# Patient Record
Sex: Female | Born: 1995 | Race: Black or African American | Hispanic: No | Marital: Single | Smoking: Current every day smoker
Health system: Southern US, Community
[De-identification: ages and names within clinical notes are randomized; demographics above are authoritative.]

## PROBLEM LIST (undated history)

## (undated) ENCOUNTER — Inpatient Hospital Stay (HOSPITAL_COMMUNITY): Payer: Self-pay

## (undated) DIAGNOSIS — J45909 Unspecified asthma, uncomplicated: Secondary | ICD-10-CM

## (undated) DIAGNOSIS — G43909 Migraine, unspecified, not intractable, without status migrainosus: Secondary | ICD-10-CM

## (undated) HISTORY — DX: Migraine, unspecified, not intractable, without status migrainosus: G43.909

## (undated) HISTORY — PX: NO PAST SURGERIES: SHX2092

---
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
## 2014-08-16 ENCOUNTER — Encounter (HOSPITAL_COMMUNITY): Payer: Self-pay | Admitting: Emergency Medicine

## 2014-08-16 ENCOUNTER — Emergency Department (HOSPITAL_COMMUNITY)
Admission: EM | Admit: 2014-08-16 | Discharge: 2014-08-16 | Disposition: A | Discharge status: Home / Self Care | Payer: Self-pay | Attending: Emergency Medicine | Admitting: Emergency Medicine

## 2014-08-16 DIAGNOSIS — Z720 Tobacco use: Secondary | ICD-10-CM | POA: Insufficient documentation

## 2014-08-16 DIAGNOSIS — J45909 Unspecified asthma, uncomplicated: Secondary | ICD-10-CM | POA: Insufficient documentation

## 2014-08-16 DIAGNOSIS — R519 Headache, unspecified: Secondary | ICD-10-CM

## 2014-08-16 DIAGNOSIS — R51 Headache: Secondary | ICD-10-CM | POA: Insufficient documentation

## 2014-08-16 HISTORY — DX: Unspecified asthma, uncomplicated: J45.909

## 2014-08-16 LAB — I-STAT CHEM 8, ED
BUN: 4 mg/dL — AB (ref 6–20)
CALCIUM ION: 1.22 mmol/L (ref 1.12–1.23)
Chloride: 104 mmol/L (ref 101–111)
Creatinine, Ser: 0.8 mg/dL (ref 0.44–1.00)
Glucose, Bld: 88 mg/dL (ref 65–99)
HCT: 44 % (ref 36.0–46.0)
Hemoglobin: 15 g/dL (ref 12.0–15.0)
POTASSIUM: 3.5 mmol/L (ref 3.5–5.1)
Sodium: 140 mmol/L (ref 135–145)
TCO2: 22 mmol/L (ref 0–100)

## 2014-08-16 LAB — I-STAT BETA HCG BLOOD, ED (MC, WL, AP ONLY): I-stat hCG, quantitative: <5 m[IU]/mL (ref <5)

## 2014-08-16 MED ORDER — BUTALBITAL-APAP-CAFFEINE 50-325-40 MG PO TABS: 1.0000 | ORAL_TABLET | Freq: Four times a day (QID) | ORAL | Status: DC | PRN

## 2014-08-16 MED ORDER — METHYLPREDNISOLONE SODIUM SUCC 125 MG IJ SOLR
125.0000 mg | Freq: Once | INTRAMUSCULAR | Status: AC
  Administered 2014-08-16: 125 mg via INTRAVENOUS
  Filled 2014-08-16: qty 2

## 2014-08-16 MED ORDER — SODIUM CHLORIDE 0.9 % IV BOLUS (SEPSIS)
1000.0000 mL | Freq: Once | INTRAVENOUS | Status: AC
  Administered 2014-08-16: 1000 mL via INTRAVENOUS

## 2014-08-16 MED ORDER — KETOROLAC TROMETHAMINE 30 MG/ML IJ SOLN
30.0000 mg | Freq: Once | INTRAMUSCULAR | Status: DC
  Filled 2014-08-16: qty 1

## 2014-08-16 MED ORDER — MAGNESIUM SULFATE 2 GM/50ML IV SOLN
2.0000 g | Freq: Once | INTRAVENOUS | Status: AC
  Administered 2014-08-16: 2 g via INTRAVENOUS
  Filled 2014-08-16: qty 50

## 2014-08-16 MED ORDER — PROCHLORPERAZINE EDISYLATE 5 MG/ML IJ SOLN
10.0000 mg | Freq: Once | INTRAMUSCULAR | Status: AC
  Administered 2014-08-16: 10 mg via INTRAVENOUS
  Filled 2014-08-16: qty 2

## 2014-08-16 MED ORDER — DIPHENHYDRAMINE HCL 50 MG/ML IJ SOLN
25.0000 mg | Freq: Once | INTRAMUSCULAR | Status: AC
  Administered 2014-08-16: 25 mg via INTRAVENOUS
  Filled 2014-08-16: qty 1

## 2014-08-16 NOTE — Discharge Instructions (Signed)
Do not hesitate to return to the emergency room for any new, worsening or concerning symptoms. ° °Please obtain primary care using resource guide below. Let them know that you were seen in the emergency room and that they will need to obtain records for further outpatient management. ° ° °General Headache Without Cause °A general headache is pain or discomfort felt around the head or neck area. The cause may not be found.  °HOME CARE  °· Keep all doctor visits. °· Only take medicines as told by your doctor. °· Lie down in a dark, quiet room when you have a headache. °· Keep a journal to find out if certain things bring on headaches. For example, write down: °¨ What you eat and drink. °¨ How much sleep you get. °¨ Any change to your diet or medicines. °· Relax by getting a massage or doing other relaxing activities. °· Put ice or heat packs on the head and neck area as told by your doctor. °· Lessen stress. °· Sit up straight. Do not tighten (tense) your muscles. °· Quit smoking if you smoke. °· Lessen how much alcohol you drink. °· Lessen how much caffeine you drink, or stop drinking caffeine. °· Eat and sleep on a regular schedule. °· Get 7 to 9 hours of sleep, or as told by your doctor. °· Keep lights dim if bright lights bother you or make your headaches worse. °GET HELP RIGHT AWAY IF:  °· Your headache becomes really bad. °· You have a fever. °· You have a stiff neck. °· You have trouble seeing. °· Your muscles are weak, or you lose muscle control. °· You lose your balance or have trouble walking. °· You feel like you will pass out (faint), or you pass out. °· You have really bad symptoms that are different than your first symptoms. °· You have problems with the medicines given to you by your doctor. °· Your medicines do not work. °· Your headache feels different than the other headaches. °· You feel sick to your stomach (nauseous) or throw up (vomit). °MAKE SURE YOU:  °· Understand these instructions. °· Will  watch your condition. °· Will get help right away if you are not doing well or get worse. °Document Released: 10/10/2007 Document Revised: 03/25/2011 Document Reviewed: 12/21/2010 °ExitCare® Patient Information ©2015 [employer]. This information is not intended to replace advice given to you by your health care provider. Make sure you discuss any questions you have with your health care provider. ° ° °Emergency Department Resource Guide °1) Find a Doctor and Pay Out of Pocket °Although you won't have to find out who is covered by your insurance plan, it is a good idea to ask around and get recommendations. You will then need to call the office and see if the doctor you have chosen will accept you as a new patient and what types of options they offer for patients who are self-pay. Some doctors offer discounts or will set up payment plans for their patients who do not have insurance, but you will need to ask so you aren't surprised when you get to your appointment. ° °2) Contact Your Local Health Department °Not all health departments have doctors that can see patients for sick visits, but many do, so it is worth a call to see if yours does. If you don't know where your local health department is, you can check in your phone book. The CDC also has a tool to help you locate your   state's health department, and many state websites also have listings of all of their local health departments. ° °3) Find a Walk-in Clinic °If your illness is not likely to be very severe or complicated, you may want to try a walk in clinic. These are popping up all over the country in pharmacies, drugstores, and shopping centers. They're usually staffed by nurse practitioners or physician assistants that have been trained to treat common illnesses and complaints. They're usually fairly quick and inexpensive. However, if you have serious medical issues or chronic medical problems, these are probably not your best option. ° °No Primary Care  Doctor: °- Call [facility] at  832-8000 - they can help you locate a primary care doctor that  accepts your insurance, provides certain services, etc. °- Physician Referral Service- 1-800-533-3463 ° °Chronic Pain Problems: °Organization         Address  Phone   Notes  °[facility]  (336) 297-2271 Patients need to be referred by their primary care doctor.  ° °Medication Assistance: °Organization         Address  Phone   Notes  °[place] [facility] [address] °[place], [place] [address] (336) 641-8030 --Must be a resident of [place] °-- Must have NO insurance coverage whatsoever (no Medicaid/ Medicare, etc.) °-- The pt. MUST have a primary care doctor that directs their care regularly and follows them in the community °  °[facility]  (866) 331-1348   °[employer]  (888) 892-1162   ° °Agencies that provide inexpensive medical care: °Organization         Address  Phone   Notes  °[facility]  (336) 832-8035   ° Internal Medicine    (336) 832-7272   °[facility] [address] °[place], [place] [address] (336) 832-4777   °[facility] [address], °[place] (336) 271-4999   °[facility]    (336) 373-0678   °[facility]    (336) 272-1050   °[facility] ° [address], [place] Phone:  (336) 832-4444, Fax:  (336) 832-4440 Hours of Operation:  9 am - 6 pm, M-F.  Also accepts Medicaid/Medicare and self-pay.  °[facility] ° [address], [place] Phone: (336) 832-3150, Fax: (336) 832-3151. Hours of Operation:  8:30 am - 5:30 pm, M-F.  Also accepts Medicaid and self-pay.  °[facility] [place] [address], [place] Phone: (336) 878-6027   °[facility] [address], [place], [place] (336)723-1848, Ext. 123 Mondays & Thursdays: 7-9 AM.  First 15 patients are seen on a first  come, first serve basis. °  ° °Medicaid-accepting [place] Providers: ° °Organization         Address  Phone   Notes  °[facility] [address], [place] (336) 641-2100 Also accepts self-pay patients.  °[facility] [address], [place] ° (336) 856-9996   °[facility] [address], [place] (336) 288-8857   °[facility] [address] [place] [address], [place] (336) 299-7000   °Veita Bland [address], [place]  ° (336) 373-1557 Only accepts  Access Medicaid patients after they have their name applied to their card.  ° °Self-Pay (no insurance) in [place]: ° °Organization           Address  Phone   Notes  °Sickle Cell Patients, [facility] [address], [place] (336) 832-1970   °[facility] [address], [place] (336) 832-4400   °[facility] ° [address], [place] (336) 992-4800   °[facility]/Dr. Osei-Bonsu ° [address] [place] [address], [place] or [address], [place] (336) 841-8500 Phone number for both [place] and [place] locations is the same.  °[facility] [address], [place] (336) 299-0000   °[facility] [address] [place] [address], [place] or [address] (336) 852-7530 °(336) 878-2260   °[facility] [address], [place] (336) 350-1642, phone; (336) 294-5005, fax Sees patients 1st and 3rd Saturday of every month.  Must not qualify for public or private insurance (i.e. Medicaid, Medicare, [place] Health Choice, Veterans' Benefits) • Household income should be no more than 200% of the poverty level •The clinic cannot treat you if you are pregnant or think you are pregnant • Sexually transmitted diseases are not treated at the clinic.  ° ° °Dental Care: °Organization          Address  Phone  Notes  °[facility] [address], [place] (336) 641-6152 Accepts children up to age 21 who are enrolled in Medicaid or [place] Health Choice; pregnant women with a Medicaid card; and children who have applied for Medicaid or [place] Health Choice, but were declined, whose parents can pay a reduced fee at time of service.  °[facility] [place]  [address], [place] (336) 641-7733 Accepts children up to age 21 who are enrolled in Medicaid or [place] Health Choice; pregnant women with a Medicaid card; and children who have applied for Medicaid or [place] Health Choice, but were declined, whose parents can pay a reduced fee at time of service.  °[facility] ° [address], [place] (336) 641-4533 Patients are seen by appointment only. Walk-ins are not accepted. [facility] will see patients 18 years of age and older. °Monday - Tuesday (8am-5pm) °Most Wednesdays (8:30-5pm) °$30 per visit, cash only  °[facility] ° [address], [place] (336) 641-4533 Patients are seen by appointment only. Walk-ins are not accepted. [facility] will see patients 18 years of age and older. °One Wednesday Evening (Monthly: Volunteer Based).  $30 per visit, cash only  °[facility]  (919) 537-3737 for adults; Children under age 4, call Graduate Pediatric Dentistry at (919) [phone]. Children aged 4-14, please call (919) [phone] to request a pediatric application. ° Dental services are provided in all areas of dental care including fillings, crowns and bridges, complete and partial dentures, implants, gum treatment, root canals, and extractions. Preventive care is also provided. Treatment is provided to both adults and children. °Patients are selected via a lottery and there is often a waiting list. °  °[facility] [address], ° ° (336) 763-8833 [email or web address] °  °[facility] [address], [place], [place] (336)723-1848, Ext. 123 Second and Fourth Thursday of each month, opens at 6:30 AM; Clinic ends at 9 AM.  Patients are seen on a first-come first-served basis, and a limited number are seen during each clinic.  ° °[facility] ° [address], [place], [place] (336) 723-7904   Eligibility Requirements °You must have lived in [place], [place], or [place] counties for at least the last three months. °  You cannot be eligible for state or federal sponsored healthcare insurance, including Veterans Administration, Medicaid, or Medicare. °  You generally cannot be eligible for healthcare insurance through your employer.  °  How to apply: °Eligibility screenings are held every Tuesday and Wednesday afternoon from 1:00 pm until 4:00 pm. You do not need an appointment for the interview!  °[facility] [address], [place], [place] 336-631-2330   °[facility]  336-342-8273   °[facility]  336-703-3100   °[facility]  336-570-6415   ° °Behavioral Health Resources in the Community: °Intensive Outpatient Programs °Organization         Address  Phone  Notes  °[facility] [address], [place], [place] 336-878-6098   °[facility] [address], [place], [place] 336-832-9800   °[facility] [address], [place], [place] ° 336-882-2125   °[facility] [address],  °[place], [place] 1-800-853-5163 or 336-641-4981   °Substance Abuse Resources °Organization         Address  Phone  Notes  °[facility]  336-882-2125   °[facility]  336-784-9470   °[facility]  336-285-9073   °[facility]  336-845-3988   °Residential & Outpatient Substance Abuse Program  1-800-659-3381   °Psychological  Services °Organization         Address  Phone  Notes  °[facility]  336- 832-9600   °[facility]  336- 378-7881   °[facility] [address], [place] 1-800-853-5163 or 336-641-4981   ° °Mobile Crisis Teams °Organization         Address  Phone  Notes  °[facility], Mobile Crisis Care Unit  1-877-626-1772   °Assertive °[facility] ° [address] [place], [place] 336-834-9664   °Sharon DeEsch [address] °[place] 336-554-5454   ° °Self-Help/Support Groups °Organization         Address  Phone             Notes  °[facility] - variety of support groups  336- 373-1402 Call for more information  °Narcotics Anonymous (NA), [facility] [address], °[place]  2 meetings at this location  ° °Residential Treatment Programs °Organization         Address  Phone  Notes  °[facility] [address],    °[place]  1-866-801-8205   °[facility] ° [address], [place], [place] 704-293-8524   °[facility] [address], [place] 336-845-3988 Admissions: 8am-3pm M-F  °[facility] [address]    °[place], [place] 336-841-1104   °[facility] [address], [place], [place] 336-379-7146   °[facility] [address]  °[place], [place] 336-285-9073   °[facility] Intensive Outpatient [address], [place], [place] 336-852-3033   °[facility] [address]  °[place], [place] 1-877-615-2722 or 336-784-9470   °[facility] [address] [place], [place] 336-227-7417 Accepts Medicaid  °[facility] [address]  °[place]  1-800-659-3381 Substance Abuse/Addiction Treatment  ° °[place] Behavioral Health Resources °Organization           Address  Phone  Notes  °[facility]  (888)  581-9988   °Julie Brannon, PhD [address] [place], [place]   (336) 349-5553 or (336) 951-0000   °[facility]   [address] °[place], [place] (336) 349-4454   °[facility] [address], [place], [place] (336) 342-8316 Insurance/Medicaid/sponsorship through [facility]  °[facility] [address]                                    [place], [place] (336) 342-8316 Therapy/tele-psych/case  °[facility] [address]  ° [place], [place] (336) 349-2233    °Dr. Arfeen  (336) 349-4544   °[facility] [place]  [employer] [place] [facility] 1) [address], [place] °2) [address], [place] °3)  [address], [place] (336) 349-3220 °(336) 342-7768 ° °(336) 342-8140   °[place] [facility] (336) 342-1394 or (336) 342-3537 (After Hours)    ° ° ° °

## 2014-08-16 NOTE — ED Notes (Signed)
The patient said she has had a headache that is intermittent since Sunday.  She says it is like a sharp pain and it is only on the left side.  She says it is near her ear.  The pain come and goes and when it is present she rates it 10/10.  She denies any nausea or vomiting, she does say she has doziness and blurred vision when the pain is present.

## 2014-08-16 NOTE — ED Provider Notes (Signed)
CSN: [id number]     Arrival date & time 08/16/14  1847 History   First MD Initiated Contact with Patient 08/16/14 2051     Chief Complaint  Patient presents with  . Headache    The patient said she has had a headache that is intermittent since Sunday.  She says it is like a sharp pain and it is only on the left side.  She says it is near her ear.       (Consider location/radiation/quality/duration/timing/severity/associated sxs/prior Treatment) HPI  Blood pressure 125/77, pulse 91, temperature 98.3 F (36.8 C), temperature source Oral, resp. rate 16, height 5\' 3"  (1.6 m), weight 140 lb (63.504 kg), last menstrual period 08/09/2014, SpO2 100 %.  Samantha Knight is a 19 y.o. female complaining of sharp intermittent pain to the left side of her head focused around the ear intermittently starting 3 days ago, states it's becoming more severe, states it's 10 out of 10 right now. Is been no pain medication taken prior to arrival. She states she's hardly been eating and drinking over the last week, thinks it may be secondary to heat. Pt denies fever, rash, confusion, cervicalgia, LOC/syncope, change in vision, N/V, numbness, weakness, dysarthria, ataxia, thunderclap onset, exacerbation with exertion or valsalva, exacerbation in morning, CP, SOB, abdominal pain.  Past Medical History  Diagnosis Date  . Asthma    History reviewed. No pertinent past surgical history. History reviewed. No pertinent family history. History  Substance Use Topics  . Smoking status: Current Every Day Smoker    Types: Cigars  . Smokeless tobacco: Never Used  . Alcohol Use: No   OB History    No data available     Review of Systems  10 systems reviewed and found to be negative, except as noted in the HPI.   Allergies  Review of patient's allergies indicates not on file.  Home Medications   Prior to Admission medications   Not on File   BP 125/77 mmHg  Pulse 91  Temp(Src) 98.3 F (36.8 C) (Oral)   Resp 16  Ht 5\' 3"  (1.6 m)  Wt 140 lb (63.504 kg)  BMI 24.81 kg/m2  SpO2 100%  LMP 08/09/2014 Physical Exam  Constitutional: She is oriented to person, place, and time. She appears well-developed and well-nourished.  HENT:  Head: Normocephalic and atraumatic.  Mouth/Throat: Oropharynx is clear and moist.  No drooling or stridor. Posterior pharynx mildly erythematous no significant tonsillar hypertrophy. No exudate. Soft palate rises symmetrically. No TTP or induration under tongue.   No tenderness to palpation of frontal or bilateral maxillary sinuses.  No mucosal edema in the nares.  Bilateral tympanic membranes with normal architecture and good light reflex.    Eyes: Conjunctivae and EOM are normal. Pupils are equal, round, and reactive to light.  No TTP of maxillary or frontal sinuses  No TTP or induration of temporal arteries bilaterally  Neck: Normal range of motion. Neck supple.  FROM to C-spine. Pt can touch chin to chest without discomfort. No TTP of midline cervical spine.   Cardiovascular: Normal rate, regular rhythm and intact distal pulses.   Pulmonary/Chest: Effort normal and breath sounds normal. No respiratory distress. She has no wheezes. She has no rales. She exhibits no tenderness.  Abdominal: Soft. Bowel sounds are normal. There is no tenderness.  Musculoskeletal: Normal range of motion. She exhibits no edema or tenderness.  Neurological: She is alert and oriented to person, place, and time. No cranial nerve deficit.  II-Visual fields  grossly intact. III/IV/VI-Extraocular movements intact.  Pupils reactive bilaterally. V/VII-Smile symmetric, equal eyebrow raise,  facial sensation intact VIII- Hearing grossly intact IX/X-Normal gag XI-bilateral shoulder shrug XII-midline tongue extension Motor: 5/5 bilaterally with normal tone and bulk Cerebellar: Normal finger-to-nose  and normal heel-to-shin test.   Romberg negative Ambulates with a coordinated gait    Nursing note and vitals reviewed.   ED Course  Procedures (including critical care time) Labs Review Labs Reviewed - No data to display  Imaging Review No results found.   EKG Interpretation None      MDM   Final diagnoses:  Nonintractable headache, unspecified chronicity pattern, unspecified headache type    Filed Vitals:   08/16/14 1919 08/16/14 2054 08/16/14 2058  BP: 118/81 125/77   Pulse: 89  91  Temp: 98.3 F (36.8 C)    TempSrc: Oral    Resp: 16    Height:  (1.6 m)    Weight: 140 lb (63.504 kg)    SpO2: 100%  100%    Medications  sodium chloride 0.9 % bolus 1,000 mL (not administered)  magnesium sulfate IVPB 2 g 50 mL (not administered)  prochlorperazine (COMPAZINE) injection 10 mg (not administered)  methylPREDNISolone sodium succinate (SOLU-MEDROL) 125 mg/2 mL injection 125 mg (not administered)  diphenhydrAMINE (BENADRYL) injection 25 mg (not administered)    Samantha Knight is a pleasant 19 y.o. female presenting with HA. Presentation non concerning for [facility], ICH, Meningitis, or temporal arteritis. Pt is afebrile with no focal neuro deficits, nuchal rigidity, or change in vision. Pt is to follow up with PCP to discuss prophylactic medication. Pt verbalizes understanding and is agreeable with plan to dc.  Evaluation does not show pathology that would require ongoing emergent intervention or inpatient treatment. Pt is hemodynamically stable and mentating appropriately. Discussed findings and plan with patient/guardian, who agrees with care plan. All questions answered. Return precautions discussed and outpatient follow up given.   Discharge Medication List as of 08/16/2014 10:27 PM    START taking these medications   Details  butalbital-acetaminophen-caffeine (FIORICET) 50-325-40 MG per tablet Take 1 tablet by mouth every 6 (six) hours as needed for headache., Starting 08/16/2014, Until Discontinued, Print             Wynetta Emery,  PA-C 08/16/14 2324  Raeford Razor, MD 08/17/14 1455

## 2016-12-12 ENCOUNTER — Ambulatory Visit: Payer: Self-pay | Admitting: Maternal Newborn

## 2017-01-10 ENCOUNTER — Ambulatory Visit (INDEPENDENT_AMBULATORY_CARE_PROVIDER_SITE_OTHER): Payer: Managed Care, Other (non HMO) | Admitting: Maternal Newborn

## 2017-01-10 ENCOUNTER — Ambulatory Visit (INDEPENDENT_AMBULATORY_CARE_PROVIDER_SITE_OTHER): Payer: Managed Care, Other (non HMO)

## 2017-01-10 ENCOUNTER — Encounter: Payer: Self-pay | Admitting: Maternal Newborn

## 2017-01-10 VITALS — BP 112/74 | HR 83 | Wt 172.0 lb

## 2017-01-10 DIAGNOSIS — Z3480 Encounter for supervision of other normal pregnancy, unspecified trimester: Secondary | ICD-10-CM | POA: Insufficient documentation

## 2017-01-10 DIAGNOSIS — N912 Amenorrhea, unspecified: Secondary | ICD-10-CM | POA: Diagnosis not present

## 2017-01-10 DIAGNOSIS — Z3401 Encounter for supervision of normal first pregnancy, first trimester: Secondary | ICD-10-CM

## 2017-01-10 DIAGNOSIS — Z362 Encounter for other antenatal screening follow-up: Secondary | ICD-10-CM

## 2017-01-10 DIAGNOSIS — Z0189 Encounter for other specified special examinations: Secondary | ICD-10-CM

## 2017-01-10 DIAGNOSIS — O219 Vomiting of pregnancy, unspecified: Secondary | ICD-10-CM | POA: Insufficient documentation

## 2017-01-10 DIAGNOSIS — Z6830 Body mass index (BMI) 30.0-30.9, adult: Secondary | ICD-10-CM | POA: Insufficient documentation

## 2017-01-10 LAB — POCT URINE PREGNANCY: Preg Test, Ur: POSITIVE — AB

## 2017-01-10 MED ORDER — DOXYLAMINE-PYRIDOXINE 10-10 MG PO TBEC: 2.0000 | DELAYED_RELEASE_TABLET | Freq: Every day | ORAL | 5 refills | Status: DC

## 2017-01-10 NOTE — Progress Notes (Signed)
01/10/2017   Chief Complaint: Amenorrhea, positive home pregnancy test, desires prenatal care.  Transfer of Care Patient: no  History of Present Illness: [name] is a 21 y.o. G1P0000 at 9w 0d based on Patient's last menstrual period on 11/08/2016 (exact date), with an Estimated Date of Delivery: 08/15/2017, with the above CC.   Her periods were: regular periods every 28 days, last period was slightly off in timing She was using no method when she conceived.  She has Positive signs or symptoms of nausea/vomiting of pregnancy. She has Negative signs or symptoms of miscarriage or preterm labor She identifies Negative Zika risk factors for her and her partner On any different medications around the time she conceived/early pregnancy: No  History of varicella: No   ROS: A 12-point review of systems was performed and negative, except as stated in the above HPI.  OBGYN History: As per HPI. OB History  Gravida Para Term Preterm AB Living  1 0 0     0  SAB TAB Ectopic Multiple Live Births               # Outcome Date GA Lbr Len/2nd Weight Sex Delivery Anes PTL Lv  1 Current               Any issues with any prior pregnancies: not applicable Any prior children are healthy, doing well, without any problems or issues: not applicable History of pap smears: Yes. Last pap smear 2017. Patient reports normal, [facility]. History of STIs: No   Past Medical History: Past Medical History:  Diagnosis Date  . Asthma     Past Surgical History: History reviewed. No pertinent surgical history.  Family History:  History reviewed. No pertinent family history. She denies any female cancers, bleeding or blood clotting disorders.  She denies any history of intellectual disability, birth defects or genetic disorders in her or the FOB's history  Social History:  Social History   Socioeconomic History  . Marital status: Single    Spouse name: Not on file  . Number of  children: Not on file  . Years of education: Not on file  . Highest education level: Not on file  Social Needs  . Financial resource strain: Not on file  . Food insecurity - worry: Not on file  . Food insecurity - inability: Not on file  . Transportation needs - medical: Not on file  . Transportation needs - non-medical: Not on file  Occupational History  . Not on file  Tobacco Use  . Smoking status: Current Every Day Smoker    Types: Cigars  . Smokeless tobacco: Never Used  Substance and Sexual Activity  . Alcohol use: No  . Drug use: No  . Sexual activity: Yes    Birth control/protection: Condom  Other Topics Concern  . Not on file  Social History Narrative  . Not on file   Any cats in the household: no   Allergy: No Known Allergies  Current Outpatient Medications:  Current Outpatient Medications:  .  butalbital-acetaminophen-caffeine (FIORICET) 50-325-40 MG per tablet, Take 1 tablet by mouth every 6 (six) hours as needed for headache. (Patient not taking: Reported on 01/10/2017), Disp: 20 tablet, Rfl: 0 .  Doxylamine-Pyridoxine (DICLEGIS) 10-10 MG TBEC, Take 2 tablets by mouth at bedtime. If symptoms persist, add one tablet in the morning and one in the afternoon, Disp: 100 tablet, Rfl: 5   Physical Exam:   BP 112/74   Pulse 83  Wt 172 lb (78 kg)   LMP 11/08/2016 (Exact Date)   BMI 30.47 kg/m  Body mass index is 30.47 kg/m. Constitutional: Well nourished, well developed female in no acute distress.  Neck:  Supple, normal appearance, and no thyromegaly  Cardiovascular: S1, S2 normal, no murmur, rub or gallop, regular rate and rhythm Respiratory:  Clear to auscultation bilateral. Normal respiratory effort Abdomen: positive bowel sounds and no masses, hernias; diffusely non tender to palpation, non distended Breasts: right breast normal without mass, skin or nipple changes or axillary nodes, left breast normal without mass, skin or nipple changes or axillary  nodes, risk and benefit of breast self-exam was discussed. Neuro/Psych:  Normal mood and affect.  Skin:  Warm and dry.  Lymphatic:  No inguinal lymphadenopathy.   Pelvic exam: is not limited by body habitus External genitalia, Bartholin's glands, Urethra, Skene's glands: within normal limits Vagina: within normal limits and with no blood in the vault  Cervix: normal appearing cervix without discharge or lesions, closed/long/high Uterus:  enlarged: consistent with pregnancy Adnexa:  no mass, fullness, tenderness  Assessment: [name] is a 21 y.o. G1P0000 presenting for prenatal care.  Plan:  1) Avoid alcoholic beverages. 2) Patient encouraged not to smoke.  3) Discontinue the use of all non-medicinal drugs and chemicals.  4) Take prenatal vitamins daily.  5) Seatbelt use advised. 6) Nutrition, food safety (fish, cheese advisories, and high nitrite foods) and exercise discussed. 7) Hospital and practice style delivering at [facility] discussed. 8) Patient is asked about travel to areas at risk for the Zika virus, and counseled to avoid travel and exposure to mosquitoes or sexual partners who may have themselves been exposed to the virus. Testing is discussed, and will be ordered as appropriate.  9) Childbirth classes at [facility] advised. 10) Genetic Screening, such as with 1st Trimester Screening, cell free fetal DNA, AFP testing, and Ultrasound, as well as with amniocentesis and CVS as appropriate, is discussed with patient. She is undecided about genetic testing for this pregnancy. 11) Sample given of Bonjesta and prenatal vitamins and Diclegis Rx sent. 12) Ultrasound done today. Patient uncertain about dating. Ultrasound shows single IUP with cardiac activity at 5w 6d, follow up recommended as LMP dating was 9w and unable to count FHR.     Problem list reviewed and updated.  Return in about 2 weeks (around 01/24/2017) for GTT/NOB labs, ROB and f/u ultrasound.  Marcelyn BruinsJacelyn Schmid,  CNM [facility], [facility] 01/10/2017  4:32 PM

## 2017-01-10 NOTE — Patient Instructions (Signed)
First Trimester of Pregnancy The first trimester of pregnancy is from week 1 until the end of week 13 (months 1 through 3). A week after a sperm fertilizes an egg, the egg will implant on the wall of the uterus. This embryo will begin to develop into a baby. Genes from you and your partner will form the baby. The female genes will determine whether the baby will be a boy or a girl. At 6-8 weeks, the eyes and face will be formed, and the heartbeat can be seen on ultrasound. At the end of 12 weeks, all the baby's organs will be formed. Now that you are pregnant, you will want to do everything you can to have a healthy baby. Two of the most important things are to get good prenatal care and to follow your health care provider's instructions. Prenatal care is all the medical care you receive before the baby's birth. This care will help prevent, find, and treat any problems during the pregnancy and childbirth. Body changes during your first trimester Your body goes through many changes during pregnancy. The changes vary from woman to woman.  You may gain or lose a couple of pounds at first.  You may feel sick to your stomach (nauseous) and you may throw up (vomit). If the vomiting is uncontrollable, call your health care provider.  You may tire easily.  You may develop headaches that can be relieved by medicines. All medicines should be approved by your health care provider.  You may urinate more often. Painful urination may mean you have a bladder infection.  You may develop heartburn as a result of your pregnancy.  You may develop constipation because certain hormones are causing the muscles that push stool through your intestines to slow down.  You may develop hemorrhoids or swollen veins (varicose veins).  Your breasts may begin to grow larger and become tender. Your nipples may stick out more, and the tissue that surrounds them (areola) may become darker.  Your gums may bleed and may be  sensitive to brushing and flossing.  Dark spots or blotches (chloasma, mask of pregnancy) may develop on your face. This will likely fade after the baby is born.  Your menstrual periods will stop.  You may have a loss of appetite.  You may develop cravings for certain kinds of food.  You may have changes in your emotions from day to day, such as being excited to be pregnant or being concerned that something may go wrong with the pregnancy and baby.  You may have more vivid and strange dreams.  You may have changes in your hair. These can include thickening of your hair, rapid growth, and changes in texture. Some women also have hair loss during or after pregnancy, or hair that feels dry or thin. Your hair will most likely return to normal after your baby is born.  What to expect at prenatal visits During a routine prenatal visit:  You will be weighed to make sure you and the baby are growing normally.  Your blood pressure will be taken.  Your abdomen will be measured to track your baby's growth.  The fetal heartbeat will be listened to between weeks 10 and 14 of your pregnancy.  Test results from any previous visits will be discussed.  Your health care provider may ask you:  How you are feeling.  If you are feeling the baby move.  If you have had any abnormal symptoms, such as leaking fluid, bleeding, severe headaches,   or abdominal cramping.  If you are using any tobacco products, including cigarettes, chewing tobacco, and electronic cigarettes.  If you have any questions.  Other tests that may be performed during your first trimester include:  Blood tests to find your blood type and to check for the presence of any previous infections. The tests will also be used to check for low iron levels (anemia) and protein on red blood cells (Rh antibodies). Depending on your risk factors, or if you previously had diabetes during pregnancy, you may have tests to check for high blood  sugar that affects pregnant women (gestational diabetes).  Urine tests to check for infections, diabetes, or protein in the urine.  An ultrasound to confirm the proper growth and development of the baby.  Fetal screens for spinal cord problems (spina bifida) and Down syndrome.  HIV (human immunodeficiency virus) testing. Routine prenatal testing includes screening for HIV, unless you choose not to have this test.  You may need other tests to make sure you and the baby are doing well.  Follow these instructions at home: Medicines  Follow your health care provider's instructions regarding medicine use. Specific medicines may be either safe or unsafe to take during pregnancy.  Take a prenatal vitamin that contains at least 600 micrograms (mcg) of folic acid.  If you develop constipation, try taking a stool softener if your health care provider approves. Eating and drinking  Eat a balanced diet that includes fresh fruits and vegetables, whole grains, good sources of protein such as meat, eggs, or tofu, and low-fat dairy. Your health care provider will help you determine the amount of weight gain that is right for you.  Avoid raw meat and uncooked cheese. These carry germs that can cause birth defects in the baby.  Eating four or five small meals rather than three large meals a day may help relieve nausea and vomiting. If you start to feel nauseous, eating a few soda crackers can be helpful. Drinking liquids between meals, instead of during meals, also seems to help ease nausea and vomiting.  Limit foods that are high in fat and processed sugars, such as fried and sweet foods.  To prevent constipation: ? Eat foods that are high in fiber, such as fresh fruits and vegetables, whole grains, and beans. ? Drink enough fluid to keep your urine clear or pale yellow. Activity  Exercise only as directed by your health care provider. Most women can continue their usual exercise routine during  pregnancy. Try to exercise for 30 minutes at least 5 days a week. Exercising will help you: ? Control your weight. ? Stay in shape. ? Be prepared for labor and delivery.  Experiencing pain or cramping in the lower abdomen or lower back is a good sign that you should stop exercising. Check with your health care provider before continuing with normal exercises.  Try to avoid standing for long periods of time. Move your legs often if you must stand in one place for a long time.  Avoid heavy lifting.  Wear low-heeled shoes and practice good posture.  You may continue to have sex unless your health care provider tells you not to. Relieving pain and discomfort  Wear a good support bra to relieve breast tenderness.  Take warm sitz baths to soothe any pain or discomfort caused by hemorrhoids. Use hemorrhoid cream if your health care provider approves.  Rest with your legs elevated if you have leg cramps or low back pain.  If you develop   varicose veins in your legs, wear support hose. Elevate your feet for 15 minutes, 3-4 times a day. Limit salt in your diet. Prenatal care  Schedule your prenatal visits by the twelfth week of pregnancy. They are usually scheduled monthly at first, then more often in the last 2 months before delivery.  Write down your questions. Take them to your prenatal visits.  Keep all your prenatal visits as told by your health care provider. This is important. Safety  Wear your seat belt at all times when driving.  Make a list of emergency phone numbers, including numbers for family, friends, the hospital, and police and fire departments. General instructions  Ask your health care provider for a referral to a local prenatal education class. Begin classes no later than the beginning of month 6 of your pregnancy.  Ask for help if you have counseling or nutritional needs during pregnancy. Your health care provider can offer advice or refer you to specialists for help  with various needs.  Do not use hot tubs, steam rooms, or saunas.  Do not douche or use tampons or scented sanitary pads.  Do not cross your legs for long periods of time.  Avoid cat litter boxes and soil used by cats. These carry germs that can cause birth defects in the baby and possibly loss of the fetus by miscarriage or stillbirth.  Avoid all smoking, herbs, alcohol, and medicines not prescribed by your health care provider. Chemicals in these products affect the formation and growth of the baby.  Do not use any products that contain nicotine or tobacco, such as cigarettes and e-cigarettes. If you need help quitting, ask your health care provider. You may receive counseling support and other resources to help you quit.  Schedule a dentist appointment. At home, brush your teeth with a soft toothbrush and be gentle when you floss. Contact a health care provider if:  You have dizziness.  You have mild pelvic cramps, pelvic pressure, or nagging pain in the abdominal area.  You have persistent nausea, vomiting, or diarrhea.  You have a bad smelling vaginal discharge.  You have pain when you urinate.  You notice increased swelling in your face, hands, legs, or ankles.  You are exposed to fifth disease or chickenpox.  You are exposed to German measles (rubella) and have never had it. Get help right away if:  You have a fever.  You are leaking fluid from your vagina.  You have spotting or bleeding from your vagina.  You have severe abdominal cramping or pain.  You have rapid weight gain or loss.  You vomit blood or material that looks like coffee grounds.  You develop a severe headache.  You have shortness of breath.  You have any kind of trauma, such as from a fall or a car accident. Summary  The first trimester of pregnancy is from week 1 until the end of week 13 (months 1 through 3).  Your body goes through many changes during pregnancy. The changes vary from  woman to woman.  You will have routine prenatal visits. During those visits, your health care provider will examine you, discuss any test results you may have, and talk with you about how you are feeling. This information is not intended to replace advice given to you by your health care provider. Make sure you discuss any questions you have with your health care provider. Document Released: 12/25/2000 Document Revised: 12/13/2015 Document Reviewed: 12/13/2015 Elsevier Interactive Patient Education  2018 Elsevier   Inc.  

## 2017-01-12 LAB — URINE DRUG PANEL 7
Amphetamines, Urine: NEGATIVE ng/mL
Barbiturate Quant, Ur: NEGATIVE ng/mL
Benzodiazepine Quant, Ur: NEGATIVE ng/mL
Cannabinoid Quant, Ur: NEGATIVE ng/mL
Cocaine (Metab.): NEGATIVE ng/mL
Opiate Quant, Ur: NEGATIVE ng/mL
PCP Quant, Ur: NEGATIVE ng/mL

## 2017-01-12 LAB — URINE CULTURE

## 2017-01-12 LAB — GC/CHLAMYDIA PROBE AMP
Chlamydia trachomatis, NAA: NEGATIVE
Neisseria gonorrhoeae by PCR: NEGATIVE

## 2017-01-14 NOTE — L&D Delivery Note (Addendum)
Patient is 22 y.o. G1P0000 1150w2d admitted in active labor.   Delivery Note At 5:45 PM a viable female was delivered via Vaginal, Spontaneous (Presentation: Cephalic; LOA ).  APGAR: 8, 9; weight pending.   Placenta status: Spontaneous, intact.  Cord: 3VC    Anesthesia:  none Episiotomy: None Lacerations:  Left and right minor labial tears, hemostatic, no sutures placed Suture Repair: none Est. Blood Loss (mL):  300  Mom to postpartum.  Baby to Couplet care / Skin to Skin.  Mirian Moeter Frank 09/01/2017, 6:08 PM      Upon arrival patient was complete and pushing with significant pain due to lack to time for epidural placement. She pushed with good maternal effort to deliver a healthy baby boy. Baby delivered without difficulty, was noted to have good tone and place on maternal abdomen for oral suctioning, drying and stimulation. Delayed cord clamping performed. Placenta delivered intact with 3V cord. Vaginal canal and perineum was inspected and two labial tears noted (right and left, hemostatic, no perineal lacerations); hemostatic. Pitocin was started and uterus massaged until bleeding slowed. Counts of sharps, instruments, and lap pads were all correct.   Mirian MoPeter Frank, MD PGY-1 8/19/20196:08 PM    OB FELLOW DELIVERY ATTESTATION  I was gloved and present for the delivery in its entirety, and I agree with the above resident's note.    Marcy Sirenatherine Terrie Grajales, D.O. OB Fellow  09/01/2017, 6:24 PM

## 2017-01-16 ENCOUNTER — Encounter: Payer: Self-pay | Admitting: Obstetrics and Gynecology

## 2017-01-16 ENCOUNTER — Ambulatory Visit (INDEPENDENT_AMBULATORY_CARE_PROVIDER_SITE_OTHER): Payer: Managed Care, Other (non HMO) | Admitting: Obstetrics and Gynecology

## 2017-01-16 VITALS — BP 104/64 | Wt 172.0 lb

## 2017-01-16 DIAGNOSIS — Z3A01 Less than 8 weeks gestation of pregnancy: Secondary | ICD-10-CM

## 2017-01-16 DIAGNOSIS — O219 Vomiting of pregnancy, unspecified: Secondary | ICD-10-CM

## 2017-01-16 DIAGNOSIS — Z3401 Encounter for supervision of normal first pregnancy, first trimester: Secondary | ICD-10-CM

## 2017-01-16 DIAGNOSIS — G4452 New daily persistent headache (NDPH): Secondary | ICD-10-CM

## 2017-01-16 MED ORDER — PYRIDOXINE HCL 25 MG PO TABS: 25.0000 mg | ORAL_TABLET | Freq: Four times a day (QID) | ORAL | 3 refills | Status: DC | PRN

## 2017-01-16 MED ORDER — BUTALBITAL-APAP-CAFFEINE 50-325-40 MG PO CAPS: 1.0000 | ORAL_CAPSULE | Freq: Four times a day (QID) | ORAL | 0 refills | Status: DC | PRN

## 2017-01-16 MED ORDER — PROCHLORPERAZINE 25 MG RE SUPP: 25.0000 mg | Freq: Two times a day (BID) | RECTAL | 4 refills | Status: DC | PRN

## 2017-01-16 MED ORDER — DOXYLAMINE SUCCINATE (SLEEP) 25 MG PO TABS: 25.0000 mg | ORAL_TABLET | Freq: Every day | ORAL | 2 refills | Status: DC

## 2017-01-16 NOTE — Progress Notes (Signed)
Routine Prenatal Care Visit  Subjective  Samantha Knight is a 22 y.o. G1P0000 at [redacted]w[redacted]d being seen today for ongoing prenatal care.  She is currently monitored for the following issues for this low-risk pregnancy and has Encounter for supervision of normal first pregnancy in first trimester; BMI 30.0-30.9,adult; and Nausea and vomiting during pregnancy prior to [redacted] weeks gestation on their problem list.  ----------------------------------------------------------------------------------- Patient reports headache. Pt c/o headache that started 4 days ago. Has taken tylenol with no relief. Pt states pain is 10 of 10 when active and 9 of 10 when resting. Pt has history of migraines 2 years ago she took naproxen at that time but it did not help. Denies vision changes. Denies aura.  Patient reports that she recently had a dramatic reduction in her caffeine consumption. She was drinking a large amount of coke. More than 6 bottles of soda a day. She is not drinking caffeine currently.   Patient having persistent nausea and vomiting. Not helped by bonjesta. She can not take even a sip of water currently without being nauseous. No weight loss. Contractions: Not present. Vag. Bleeding: None.  Movement: Absent. Denies leaking of fluid.  ----------------------------------------------------------------------------------- The following portions of the patient's history were reviewed and updated as appropriate: allergies, current medications, past family history, past medical history, past social history, past surgical history and problem list. Problem list updated.   Objective  Blood pressure 104/64, weight 172 lb (78 kg), last menstrual period 11/08/2016. Pregravid weight 172 lb (78 kg) Total Weight Gain 0 lb (0 kg) Urinalysis: Urine Protein: Negative Urine Glucose: Negative  Fetal Status:     Movement: Absent     General:  Alert, oriented and cooperative. Patient is in no acute distress.  Skin: Skin is  warm and dry. No rash noted.   Cardiovascular: Normal heart rate noted  Respiratory: Normal respiratory effort, no problems with respiration noted  Abdomen: Soft, gravid, appropriate for gestational age. Pain/Pressure: Absent     Pelvic:  Cervical exam deferred        Extremities: Normal range of motion.     ental Status: Normal mood and affect. Normal behavior. Normal judgment and thought content.     Assessment   22 y.o. G1P0000 at [redacted]w[redacted]d by  09/06/2017, by Ultrasound presenting for work-in prenatal visit  Plan   Pregnancy #1 Problems (from 11/08/16 to present)    Problem Noted Resolved   Encounter for supervision of normal first pregnancy in first trimester 01/10/2017 by Oswaldo Conroy, CNM No   Overview Addendum 01/16/2017 12:53 PM by Natale Milch, MD    Clinic [facility] Prenatal Labs  Dating  Blood type:     Genetic Screen 1 Screen:    AFP:     Quad:     NIPS: Antibody:   Anatomic [place]  Rubella:   Varicella:    GTT Early:               Third trimester:  RPR:     Rhogam  HBsAg:     TDaP vaccine                       Flu Shot: HIV:     Baby Food                                GBS:   Contraception  Pap:  CBB  CS/VBAC    Support Person                  Preterm labor symptoms and general obstetric precautions including but not limited to vaginal bleeding, contractions, leaking of fluid and fetal movement were reviewed in detail with the patient. Please refer to After Visit Summary for other counseling recommendations.   Headache likely related to caffeine withdrawal, prescribed fiorcet. Advised hydration with water.   Advised her to start taking B6 q6hr and Unisom QHS. Prescribed rectal suppository for nausea since patient could not tolerate anything by mouth.    Return if symptoms worsen or fail to improve, for rob as planned.

## 2017-01-24 ENCOUNTER — Other Ambulatory Visit: Payer: Managed Care, Other (non HMO)

## 2017-01-24 ENCOUNTER — Ambulatory Visit (INDEPENDENT_AMBULATORY_CARE_PROVIDER_SITE_OTHER): Payer: Managed Care, Other (non HMO)

## 2017-01-24 ENCOUNTER — Ambulatory Visit (INDEPENDENT_AMBULATORY_CARE_PROVIDER_SITE_OTHER): Payer: Managed Care, Other (non HMO) | Admitting: Obstetrics & Gynecology

## 2017-01-24 VITALS — BP 100/60 | Wt 174.0 lb

## 2017-01-24 DIAGNOSIS — Z0189 Encounter for other specified special examinations: Secondary | ICD-10-CM | POA: Diagnosis not present

## 2017-01-24 DIAGNOSIS — Z362 Encounter for other antenatal screening follow-up: Secondary | ICD-10-CM

## 2017-01-24 DIAGNOSIS — Z3A01 Less than 8 weeks gestation of pregnancy: Secondary | ICD-10-CM

## 2017-01-24 DIAGNOSIS — Z6830 Body mass index (BMI) 30.0-30.9, adult: Secondary | ICD-10-CM

## 2017-01-24 DIAGNOSIS — Z3401 Encounter for supervision of normal first pregnancy, first trimester: Secondary | ICD-10-CM

## 2017-01-24 DIAGNOSIS — O219 Vomiting of pregnancy, unspecified: Secondary | ICD-10-CM

## 2017-01-24 NOTE — Progress Notes (Signed)
Review of ULTRASOUND.    I have personally reviewed images and report of recent [facility].    Plan of management to be discussed with patient.  Labs today  Annamarie MajorPaul Harris, MD, Merlinda FrederickFACOG [facility], [facility] 01/24/2017  10:49 AM

## 2017-01-27 LAB — RPR+RH+ABO+RUB AB+AB SCR+CB...
ANTIBODY SCREEN: NEGATIVE
HEMATOCRIT: 34.8 % (ref 34.0–46.6)
HIV Screen 4th Generation wRfx: NONREACTIVE
Hemoglobin: 11.5 g/dL (ref 11.1–15.9)
Hepatitis B Surface Ag: NEGATIVE
MCH: 30.7 pg (ref 26.6–33.0)
MCHC: 33 g/dL (ref 31.5–35.7)
MCV: 93 fL (ref 79–97)
Platelets: 244 10*3/uL (ref 150–379)
RBC: 3.74 x10E6/uL — AB (ref 3.77–5.28)
RDW: 13 % (ref 12.3–15.4)
RPR Ser Ql: NONREACTIVE
RUBELLA: 3.15 {index} (ref >0.99)
Rh Factor: POSITIVE
Varicella zoster IgG: 572 index (ref >165)
WBC: 5.5 10*3/uL (ref 3.4–10.8)

## 2017-01-27 LAB — HEMOGLOBINOPATHY EVALUATION
HGB A: 97.5 % (ref 96.4–98.8)
HGB C: 0 %
HGB S: 0 %
HGB VARIANT: 0 %
Hemoglobin A2 Quantitation: 2.5 % (ref 1.8–3.2)
Hemoglobin F Quantitation: 0 % (ref 0.0–2.0)

## 2017-01-27 LAB — GLUCOSE, 1 HOUR GESTATIONAL: Gestational Diabetes Screen: 74 mg/dL (ref 65–139)

## 2017-01-29 ENCOUNTER — Telehealth: Payer: Self-pay

## 2017-01-29 NOTE — Telephone Encounter (Signed)
Pt would like to discuss her N/V & loss of appetite. ZO#[phone]#[phone]

## 2017-01-30 ENCOUNTER — Encounter (HOSPITAL_COMMUNITY): Payer: Self-pay | Admitting: *Deleted

## 2017-01-30 ENCOUNTER — Other Ambulatory Visit: Payer: Self-pay

## 2017-01-30 ENCOUNTER — Inpatient Hospital Stay (HOSPITAL_COMMUNITY)
Admission: AD | Admit: 2017-01-30 | Discharge: 2017-01-30 | Disposition: A | Discharge status: Home / Self Care | Payer: Managed Care, Other (non HMO) | Source: Ambulatory Visit | Attending: Obstetrics and Gynecology | Admitting: Obstetrics and Gynecology

## 2017-01-30 DIAGNOSIS — O219 Vomiting of pregnancy, unspecified: Secondary | ICD-10-CM | POA: Diagnosis not present

## 2017-01-30 DIAGNOSIS — O99511 Diseases of the respiratory system complicating pregnancy, first trimester: Secondary | ICD-10-CM | POA: Insufficient documentation

## 2017-01-30 DIAGNOSIS — Z87891 Personal history of nicotine dependence: Secondary | ICD-10-CM | POA: Insufficient documentation

## 2017-01-30 DIAGNOSIS — J45909 Unspecified asthma, uncomplicated: Secondary | ICD-10-CM | POA: Insufficient documentation

## 2017-01-30 DIAGNOSIS — Z79899 Other long term (current) drug therapy: Secondary | ICD-10-CM | POA: Diagnosis not present

## 2017-01-30 DIAGNOSIS — O26891 Other specified pregnancy related conditions, first trimester: Secondary | ICD-10-CM | POA: Diagnosis not present

## 2017-01-30 DIAGNOSIS — R112 Nausea with vomiting, unspecified: Secondary | ICD-10-CM | POA: Diagnosis present

## 2017-01-30 DIAGNOSIS — Z3A08 8 weeks gestation of pregnancy: Secondary | ICD-10-CM | POA: Diagnosis not present

## 2017-01-30 DIAGNOSIS — R51 Headache: Secondary | ICD-10-CM | POA: Insufficient documentation

## 2017-01-30 LAB — URINALYSIS, ROUTINE W REFLEX MICROSCOPIC
Bilirubin Urine: NEGATIVE
Glucose, UA: NEGATIVE mg/dL
Hgb urine dipstick: NEGATIVE
Ketones, ur: NEGATIVE mg/dL
Nitrite: NEGATIVE
PROTEIN: NEGATIVE mg/dL
SPECIFIC GRAVITY, URINE: 1.021 (ref 1.005–1.030)
pH: 7 (ref 5.0–8.0)

## 2017-01-30 MED ORDER — PROMETHAZINE HCL 25 MG PO TABS: 25.0000 mg | ORAL_TABLET | Freq: Four times a day (QID) | ORAL | 1 refills | Status: DC | PRN

## 2017-01-30 MED ORDER — PRENATAL ADULT GUMMY/DHA/FA 0.4-25 MG PO CHEW: 1.0000 | CHEWABLE_TABLET | Freq: Every day | ORAL | 8 refills | Status: DC

## 2017-01-30 NOTE — Telephone Encounter (Signed)
Pt seems to be at [facility] now for these concerns. Still call and check on patient. Continue Unisom/Diclegis regimen; three times a day. She can have/or has suppositories prescribed as well for breakthru nausea.    (Phenergan or Compazine) If she needs more let me know.

## 2017-01-30 NOTE — MAU Provider Note (Signed)
History     CSN: [id number]  Arrival date and time: 01/30/17 1128   First Provider Initiated Contact with Patient 01/30/17 1214      Chief Complaint  Patient presents with  . Emesis   HPI   Patient is a 22 y.o. G1P0 female at 4724w5d gestation who presents to the MAU today for persistent vomiting, nausea, and headaches that have been occurring for the past 4 weeks. She states that over the past 4 days her symptoms have worsened. She reports 4-6 episodes of vomiting per day. She states she is unable to keep any food or water down. She has tried eating crackers, and drinking ginger ale, juice, and water without any relief. She was prescribed Unisom and diclegis by OB and has had no relief. She has not tried any other medication for nausea or vomiting.   She reports her headaches have been occurring since pregnancy. She describes her headaches as unilateral and behind her eye. She states that she gets both right and left sided headaches. She has taken Butalbital with some relief.   She also reports cold symptoms that have been occurring for 1-2 days. She denies any fevers, but reports having sick contacts. She was unsure of what she can take OTC.   Denies any vaginal bleeding or abdominal pain, other than cramping with vomiting.   OB History    Gravida Para Term Preterm AB Living   1 0 0     0   SAB TAB Ectopic Multiple Live Births                  Past Medical History:  Diagnosis Date  . Asthma   . Migraine     Past Surgical History:  Procedure Laterality Date  . NO PAST SURGERIES      Family History  Problem Relation Age of Onset  . Cancer Neg Hx   . Diabetes Neg Hx   . Heart disease Neg Hx   . Stroke Neg Hx     Social History   Tobacco Use  . Smoking status: Former Smoker    Types: Cigars  . Smokeless tobacco: Never Used  . Tobacco comment: quit with +UPT  Substance Use Topics  . Alcohol use: No  . Drug use: No    Allergies: No Known  Allergies  Medications Prior to Admission  Medication Sig Dispense Refill Last Dose  . Butalbital-APAP-Caffeine 50-325-40 MG capsule Take 1-2 capsules by mouth every 6 (six) hours as needed for headache. 30 capsule 0   . doxylamine, Sleep, (UNISOM) 25 MG tablet Take 1 tablet (25 mg total) by mouth at bedtime. 60 tablet 2   . Doxylamine-Pyridoxine (DICLEGIS) 10-10 MG TBEC Take 2 tablets by mouth at bedtime. If symptoms persist, add one tablet in the morning and one in the afternoon (Patient not taking: Reported on 01/16/2017) 100 tablet 5 Not Taking  . prochlorperazine (COMPAZINE) 25 MG suppository Place 1 suppository (25 mg total) rectally every 12 (twelve) hours as needed for nausea or vomiting. 30 suppository 4   . pyridOXINE (VITAMIN B-6) 25 MG tablet Take 1 tablet (25 mg total) by mouth 4 (four) times daily as needed (nausea and vomiting). 120 tablet 3     Review of Systems  Constitutional: Positive for activity change (decreased). Negative for fever.  HENT: Positive for congestion.   Gastrointestinal: Positive for abdominal pain (cramping lower middle), nausea and vomiting. Negative for diarrhea.  Genitourinary: Positive for vaginal discharge. Negative for vaginal bleeding  and vaginal pain.  Musculoskeletal: Negative for back pain.  Neurological: Positive for headaches.   Physical Exam   Blood pressure 105/83, pulse 93, temperature 98.3 F (36.8 C), temperature source Oral, resp. rate 16, weight 78.7 kg (173 lb 8 oz), last menstrual period 11/08/2016, SpO2 100 %.  Physical Exam  Nursing note and vitals reviewed. Constitutional: She is oriented to person, place, and time. She appears well-developed and well-nourished. No distress.  HENT:  Head: Normocephalic and atraumatic.  Cardiovascular: Normal rate, regular rhythm and normal heart sounds.  Respiratory: Effort normal and breath sounds normal. No respiratory distress.  GI: Soft. There is no tenderness. There is no rebound and no  guarding.  Neurological: She is alert and oriented to person, place, and time.  Skin: Skin is warm and dry.   Results for orders placed or performed during the hospital encounter of 01/30/17 (from the past 24 hour(s))  Urinalysis, Routine w reflex microscopic     Status: Abnormal   Collection Time: 01/30/17 11:42 AM  Result Value Ref Range   Color, Urine YELLOW YELLOW   APPearance CLEAR CLEAR   Specific Gravity, Urine 1.021 1.005 - 1.030   pH 7.0 5.0 - 8.0   Glucose, UA NEGATIVE NEGATIVE mg/dL   Hgb urine dipstick NEGATIVE NEGATIVE   Bilirubin Urine NEGATIVE NEGATIVE   Ketones, ur NEGATIVE NEGATIVE mg/dL   Protein, ur NEGATIVE NEGATIVE mg/dL   Nitrite NEGATIVE NEGATIVE   Leukocytes, UA TRACE (A) NEGATIVE   RBC / HPF 0-5 0 - 5 RBC/hpf   WBC, UA 0-5 0 - 5 WBC/hpf   Bacteria, UA RARE (A) NONE SEEN   Squamous Epithelial / LPF 0-5 (A) NONE SEEN   Mucus PRESENT      MAU Course  Procedures  MDM  UA to r/o UTI as other cause for abdominal cramping   Rx phenergan for nausea and vomiting   Assessment and Plan  Patient is a 22 y.o. G1P0 Female at [redacted]w[redacted]d with nausea and vomiting.   1. Nausea and vomiting during pregnancy prior to [redacted] weeks gestation -  Plan: Discharge patient Phenergan 25 mg po q6 hours for Nausea and vomiting   2. [redacted] weeks gestation of pregnancy -  Plan: Discharge patient Prenatal vitamins    Ilsa Iha PA-S2 01/30/2017, 12:28 PM

## 2017-01-30 NOTE — Telephone Encounter (Signed)
Spoke w/pt. She has been taking Diclegis & Unisom as prescribed at bedtime. Initially had relief, but now it doesn't seem to be helping as much as she is still N/V 5-6x/qd. She doesn't have an appetite, but is making herself eat. Pt advised to notify us if unable to keep any food/fluid down x24hrs. [facility] to advise of any additional meds/recommendations.

## 2017-01-30 NOTE — Discharge Instructions (Signed)
Start with 2 tablets every evening. If symptoms persist, add 1 tablet every morning. If symptoms persist, add 1 tablet at mid-day.    Safe Medications in Pregnancy   Acne: Benzoyl Peroxide Salicylic Acid  Backache/Headache: Tylenol: 2 regular strength every 4 hours OR              2 Extra strength every 6 hours  Colds/Coughs/Allergies: Benadryl (alcohol free) 25 mg every 6 hours as needed Breath right strips Claritin Cepacol throat lozenges Chloraseptic throat spray Cold-Eeze- up to three times per day Cough drops, alcohol free Flonase (by prescription only) Guaifenesin Mucinex Robitussin DM (plain only, alcohol free) Saline nasal spray/drops Sudafed (pseudoephedrine) & Actifed ** use only after [redacted] weeks gestation and if you do not have high blood pressure Tylenol Vicks Vaporub Zinc lozenges Zyrtec   Constipation: Colace Ducolax suppositories Fleet enema Glycerin suppositories Metamucil Milk of magnesia Miralax Senokot Smooth move tea  Diarrhea: Kaopectate Imodium A-D  *NO pepto Bismol  Hemorrhoids: Anusol Anusol HC Preparation H Tucks  Indigestion: Tums Maalox Mylanta Zantac  Pepcid  Insomnia: Benadryl (alcohol free) 25mg  every 6 hours as needed Tylenol PM Unisom, no Gelcaps  Leg Cramps: Tums MagGel  Nausea/Vomiting:  Bonine Dramamine Emetrol Ginger extract Sea bands Meclizine  Nausea medication to take during pregnancy:  Unisom (doxylamine succinate 25 mg tablets) Take one tablet daily at bedtime. If symptoms are not adequately controlled, the dose can be increased to a maximum recommended dose of two tablets daily (1/2 tablet in the morning, 1/2 tablet mid-afternoon and one at bedtime). Vitamin B6 100mg  tablets. Take one tablet twice a day (up to 200 mg per day).  Skin Rashes: Aveeno products Benadryl cream or 25mg  every 6 hours as needed Calamine Lotion 1% cortisone cream  Yeast infection: Gyne-lotrimin 7 Monistat  7  Gum/tooth pain: Anbesol  **If taking multiple medications, please check labels to avoid duplicating the same active ingredients **take medication as directed on the label ** Do not exceed 4000 mg of tylenol in 24 hours **Do not take medications that contain aspirin or ibuprofen     Eating Plan for Hyperemesis Gravidarum Hyperemesis gravidarum is a severe form of morning sickness. Because this condition causes severe nausea and vomiting, it can lead to dehydration, malnutrition, and weight loss. One way to lessen the symptoms of nausea and vomiting is to follow the eating plan for hyperemesis gravidarum. It is often used along with prescribed medicines to control your symptoms. What can I do to relieve my symptoms? Listen to your body. Everyone is different and has different preferences. Find what works best for you. Take any of the following actions that are helpful to you:  Eat and drink slowly.  Eat 5-6 small meals daily instead of 3 large meals.  Eat crackers before you get out of bed in the morning.  Try having a snack in the middle of the night.  Starchy foods are usually tolerated well. Examples include cereal, toast, bread, potatoes, pasta, rice, and pretzels.  Ginger may help with nausea. Add  tsp ground ginger to hot tea or choose ginger tea.  Try drinking 100% fruit juice or an electrolyte drink. An electrolyte drink contains sodium, potassium, and chloride.  Continue to take your prenatal vitamins as told by your health care provider. If you are having trouble taking your prenatal vitamins, talk with your health care provider about different options.  Include at least 1 serving of protein with your meals and snacks. Protein options include meats or  poultry, beans, nuts, eggs, and yogurt. Try eating a protein-rich snack before bed. Examples of these snacks include cheese and crackers or half of a peanut butter or [place] sandwich.  Consider eliminating foods that  trigger your symptoms. These may include spicy foods, coffee, high-fat foods, very sweet foods, and acidic foods.  Try meals that have more protein combined with bland, salty, lower-fat, and dry foods, such as nuts, seeds, pretzels, crackers, and cereal.  Talk with your healthcare provider about starting a supplement of vitamin B6.  Have fluids that are cold, clear, and carbonated or sour. Examples include lemonade, ginger ale, lemon-lime soda, ice water, and sparkling water.  Try lemon or mint tea.  Try brushing your teeth or using a mouth rinse after meals.  What should I avoid to reduce my symptoms? Avoiding some of the following things may help reduce your symptoms.  Foods with strong smells. Try eating meals in well-ventilated areas that are free of odors.  Drinking water or other beverages with meals. Try not to drink anything during the 30 minutes before and after your meals.  Drinking more than 1 cup of fluid at a time. Sometimes using a straw helps.  Fried or high-fat foods, such as butter and cream sauces.  Spicy foods.  Skipping meals as best as you can. Nausea can be more intense on an empty stomach. If you cannot tolerate food at that time, do not force it. Try sucking on ice chips or other frozen items, and make up for missed calories later.  Lying down within 2 hours after eating.  Environmental triggers. These may include smoky rooms, closed spaces, rooms with strong smells, warm or humid places, overly loud and noisy rooms, and rooms with motion or flickering lights.  Quick and sudden changes in your movement.  This information is not intended to replace advice given to you by your health care provider. Make sure you discuss any questions you have with your health care provider. Document Released: 10/28/2006 Document Revised: 08/30/2015 Document Reviewed: 08/01/2015 Elsevier Interactive Patient Education  [employer].

## 2017-01-30 NOTE — Telephone Encounter (Signed)
Pt aware.

## 2017-01-30 NOTE — MAU Note (Signed)
Past couple of days, has lost appetite.  Whether she eats or not, she pukes and the medicine doesn't seem to be helping.  Had been getting medicine for headaches- ran out yesterday, was getting daily headaches.  Did not call dr.

## 2017-01-30 NOTE — MAU Provider Note (Signed)
History     CSN: [id number]  Arrival date and time: 01/30/17 1128   First Provider Initiated Contact with Patient 01/30/17 1214      Chief Complaint  Patient presents with  . Emesis   HPI Samantha Knight is a 22 y.o. G1P0000 at 5523w5d who presents with n/v. Symptoms have been ongoing for the last several weeks. Was prescribed diclegis by OB -- has been taking at night only. Was also prescribed unisom & b6 -- states she has been taking this as well. States she vomits 5 times per day & has trouble keeping down food & fluids. Was able to keep down crackers today & has not vomited since this morning. Denies fever/chills, diarrhea, constipation, abdominal pain, heartburn, or vaginal bleeding. Is transferring care to [facility] -- started care at [facility] but states it's too far away for her.   OB History    Gravida Para Term Preterm AB Living   1 0 0     0   SAB TAB Ectopic Multiple Live Births                  Past Medical History:  Diagnosis Date  . Asthma   . Migraine     Past Surgical History:  Procedure Laterality Date  . NO PAST SURGERIES      Family History  Problem Relation Age of Onset  . Cancer Neg Hx   . Diabetes Neg Hx   . Heart disease Neg Hx   . Stroke Neg Hx     Social History   Tobacco Use  . Smoking status: Former Smoker    Types: Cigars  . Smokeless tobacco: Never Used  . Tobacco comment: quit with +UPT  Substance Use Topics  . Alcohol use: No  . Drug use: No    Allergies: No Known Allergies  Medications Prior to Admission  Medication Sig Dispense Refill Last Dose  . Butalbital-APAP-Caffeine 50-325-40 MG capsule Take 1-2 capsules by mouth every 6 (six) hours as needed for headache. 30 capsule 0   . doxylamine, Sleep, (UNISOM) 25 MG tablet Take 1 tablet (25 mg total) by mouth at bedtime. 60 tablet 2   . Doxylamine-Pyridoxine (DICLEGIS) 10-10 MG TBEC Take 2 tablets by mouth at bedtime. If symptoms persist, add one tablet in the morning and  one in the afternoon (Patient not taking: Reported on 01/16/2017) 100 tablet 5 Not Taking  . prochlorperazine (COMPAZINE) 25 MG suppository Place 1 suppository (25 mg total) rectally every 12 (twelve) hours as needed for nausea or vomiting. 30 suppository 4   . pyridOXINE (VITAMIN B-6) 25 MG tablet Take 1 tablet (25 mg total) by mouth 4 (four) times daily as needed (nausea and vomiting). 120 tablet 3     Review of Systems  Constitutional: Positive for appetite change. Negative for chills and fever.  Gastrointestinal: Positive for nausea and vomiting. Negative for abdominal pain, constipation and diarrhea.  Genitourinary: Negative.    Physical Exam   Blood pressure 105/83, pulse 93, temperature 98.3 F (36.8 C), temperature source Oral, resp. rate 16, weight 173 lb 8 oz (78.7 kg), last menstrual period 11/08/2016, SpO2 100 %.  Physical Exam  Nursing note and vitals reviewed. Constitutional: She is oriented to person, place, and time. She appears well-developed and well-nourished. No distress.  HENT:  Head: Normocephalic and atraumatic.  Eyes: Conjunctivae are normal. Right eye exhibits no discharge. Left eye exhibits no discharge. No scleral icterus.  Neck: Normal range of motion.  Respiratory: Effort normal. No respiratory distress.  GI: Soft.  Neurological: She is alert and oriented to person, place, and time.  Skin: Skin is warm and dry. She is not diaphoretic.  Psychiatric: She has a normal mood and affect. Her behavior is normal. Judgment and thought content normal.    MAU Course  Procedures Results for orders placed or performed during the hospital encounter of 01/30/17 (from the past 24 hour(s))  Urinalysis, Routine w reflex microscopic     Status: Abnormal   Collection Time: 01/30/17 11:42 AM  Result Value Ref Range   Color, Urine YELLOW YELLOW   APPearance CLEAR CLEAR   Specific Gravity, Urine 1.021 1.005 - 1.030   pH 7.0 5.0 - 8.0   Glucose, UA NEGATIVE NEGATIVE mg/dL    Hgb urine dipstick NEGATIVE NEGATIVE   Bilirubin Urine NEGATIVE NEGATIVE   Ketones, ur NEGATIVE NEGATIVE mg/dL   Protein, ur NEGATIVE NEGATIVE mg/dL   Nitrite NEGATIVE NEGATIVE   Leukocytes, UA TRACE (A) NEGATIVE   RBC / HPF 0-5 0 - 5 RBC/hpf   WBC, UA 0-5 0 - 5 WBC/hpf   Bacteria, UA RARE (A) NONE SEEN   Squamous Epithelial / LPF 0-5 (A) NONE SEEN   Mucus PRESENT     MDM No evidence of dehydration per u/a & VS No vomiting while in MAU Discussed medication adjustments & diet to help with symptoms  Assessment and Plan  A; 1. Nausea and vomiting during pregnancy prior to [redacted] weeks gestation   2. [redacted] weeks gestation of pregnancy    P: Discharge home Given instructions to increase diclegis D/c unisom & B6 Rx PNV gummies & phenergan Discussed reasons to return to MAU  Judeth Horn 01/30/2017, 12:49 PM

## 2017-02-14 ENCOUNTER — Encounter: Payer: Managed Care, Other (non HMO) | Admitting: Maternal Newborn

## 2017-02-19 ENCOUNTER — Ambulatory Visit (INDEPENDENT_AMBULATORY_CARE_PROVIDER_SITE_OTHER): Payer: Managed Care, Other (non HMO) | Admitting: Obstetrics & Gynecology

## 2017-02-19 ENCOUNTER — Encounter: Payer: Self-pay | Admitting: Obstetrics & Gynecology

## 2017-02-19 VITALS — BP 114/69 | HR 108 | Wt 172.0 lb

## 2017-02-19 DIAGNOSIS — Z124 Encounter for screening for malignant neoplasm of cervix: Secondary | ICD-10-CM | POA: Diagnosis not present

## 2017-02-19 DIAGNOSIS — Z3481 Encounter for supervision of other normal pregnancy, first trimester: Secondary | ICD-10-CM

## 2017-02-19 DIAGNOSIS — Z3689 Encounter for other specified antenatal screening: Secondary | ICD-10-CM

## 2017-02-19 DIAGNOSIS — Z3480 Encounter for supervision of other normal pregnancy, unspecified trimester: Secondary | ICD-10-CM

## 2017-02-19 NOTE — Patient Instructions (Signed)
Return to clinic for any scheduled appointments or obstetric concerns, or go to MAU for evaluation   Second Trimester of Pregnancy The second trimester is from week 14 through week 27 (months 4 through 6). The second trimester is often a time when you feel your best. Your body has adjusted to being pregnant, and you begin to feel better physically. Usually, morning sickness has lessened or quit completely, you may have more energy, and you may have an increase in appetite. The second trimester is also a time when the fetus is growing rapidly. At the end of the sixth month, the fetus is about 9 inches long and weighs about 1 pounds. You will likely begin to feel the baby move (quickening) between 16 and 20 weeks of pregnancy. Body changes during your second trimester Your body continues to go through many changes during your second trimester. The changes vary from woman to woman.  Your weight will continue to increase. You will notice your lower abdomen bulging out.  You may begin to get stretch marks on your hips, abdomen, and breasts.  You may develop headaches that can be relieved by medicines. The medicines should be approved by your health care provider.  You may urinate more often because the fetus is pressing on your bladder.  You may develop or continue to have heartburn as a result of your pregnancy.  You may develop constipation because certain hormones are causing the muscles that push waste through your intestines to slow down.  You may develop hemorrhoids or swollen, bulging veins (varicose veins).  You may have back pain. This is caused by: ? Weight gain. ? Pregnancy hormones that are relaxing the joints in your pelvis. ? A shift in weight and the muscles that support your balance.  Your breasts will continue to grow and they will continue to become tender.  Your gums may bleed and may be sensitive to brushing and flossing.  Dark spots or blotches (chloasma, mask of  pregnancy) may develop on your face. This will likely fade after the baby is born.  A dark line from your belly button to the pubic area (linea nigra) may appear. This will likely fade after the baby is born.  You may have changes in your hair. These can include thickening of your hair, rapid growth, and changes in texture. Some women also have hair loss during or after pregnancy, or hair that feels dry or thin. Your hair will most likely return to normal after your baby is born.  What to expect at prenatal visits During a routine prenatal visit:  You will be weighed to make sure you and the fetus are growing normally.  Your blood pressure will be taken.  Your abdomen will be measured to track your baby's growth.  The fetal heartbeat will be listened to.  Any test results from the previous visit will be discussed.  Your health care provider may ask you:  How you are feeling.  If you are feeling the baby move.  If you have had any abnormal symptoms, such as leaking fluid, bleeding, severe headaches, or abdominal cramping.  If you are using any tobacco products, including cigarettes, chewing tobacco, and electronic cigarettes.  If you have any questions.  Other tests that may be performed during your second trimester include:  Blood tests that check for: ? Low iron levels (anemia). ? High blood sugar that affects pregnant women (gestational diabetes) between 24 and 28 weeks. ? Rh antibodies. This is to check   for a protein on red blood cells (Rh factor).  Urine tests to check for infections, diabetes, or protein in the urine.  An ultrasound to confirm the proper growth and development of the baby.  An amniocentesis to check for possible genetic problems.  Fetal screens for spina bifida and Down syndrome.  HIV (human immunodeficiency virus) testing. Routine prenatal testing includes screening for HIV, unless you choose not to have this test.  Follow these instructions at  home: Medicines  Follow your health care provider's instructions regarding medicine use. Specific medicines may be either safe or unsafe to take during pregnancy.  Take a prenatal vitamin that contains at least 600 micrograms (mcg) of folic acid.  If you develop constipation, try taking a stool softener if your health care provider approves. Eating and drinking  Eat a balanced diet that includes fresh fruits and vegetables, whole grains, good sources of protein such as meat, eggs, or tofu, and low-fat dairy. Your health care provider will help you determine the amount of weight gain that is right for you.  Avoid raw meat and uncooked cheese. These carry germs that can cause birth defects in the baby.  If you have low calcium intake from food, talk to your health care provider about whether you should take a daily calcium supplement.  Limit foods that are high in fat and processed sugars, such as fried and sweet foods.  To prevent constipation: ? Drink enough fluid to keep your urine clear or pale yellow. ? Eat foods that are high in fiber, such as fresh fruits and vegetables, whole grains, and beans. Activity  Exercise only as directed by your health care provider. Most women can continue their usual exercise routine during pregnancy. Try to exercise for 30 minutes at least 5 days a week. Stop exercising if you experience uterine contractions.  Avoid heavy lifting, wear low heel shoes, and practice good posture.  A sexual relationship may be continued unless your health care provider directs you otherwise. Relieving pain and discomfort  Wear a good support bra to prevent discomfort from breast tenderness.  Take warm sitz baths to soothe any pain or discomfort caused by hemorrhoids. Use hemorrhoid cream if your health care provider approves.  Rest with your legs elevated if you have leg cramps or low back pain.  If you develop varicose veins, wear support hose. Elevate your feet  for 15 minutes, 3-4 times a day. Limit salt in your diet. Prenatal Care  Write down your questions. Take them to your prenatal visits.  Keep all your prenatal visits as told by your health care provider. This is important. Safety  Wear your seat belt at all times when driving.  Make a list of emergency phone numbers, including numbers for family, friends, the hospital, and police and fire departments. General instructions  Ask your health care provider for a referral to a local prenatal education class. Begin classes no later than the beginning of month 6 of your pregnancy.  Ask for help if you have counseling or nutritional needs during pregnancy. Your health care provider can offer advice or refer you to specialists for help with various needs.  Do not use hot tubs, steam rooms, or saunas.  Do not douche or use tampons or scented sanitary pads.  Do not cross your legs for long periods of time.  Avoid cat litter boxes and soil used by cats. These carry germs that can cause birth defects in the baby and possibly loss of the fetus   by miscarriage or stillbirth.  Avoid all smoking, herbs, alcohol, and unprescribed drugs. Chemicals in these products can affect the formation and growth of the baby.  Do not use any products that contain nicotine or tobacco, such as cigarettes and e-cigarettes. If you need help quitting, ask your health care provider.  Visit your dentist if you have not gone yet during your pregnancy. Use a soft toothbrush to brush your teeth and be gentle when you floss. Contact a health care provider if:  You have dizziness.  You have mild pelvic cramps, pelvic pressure, or nagging pain in the abdominal area.  You have persistent nausea, vomiting, or diarrhea.  You have a bad smelling vaginal discharge.  You have pain when you urinate. Get help right away if:  You have a fever.  You are leaking fluid from your vagina.  You have spotting or bleeding from your  vagina.  You have severe abdominal cramping or pain.  You have rapid weight gain or weight loss.  You have shortness of breath with chest pain.  You notice sudden or extreme swelling of your face, hands, ankles, feet, or legs.  You have not felt your baby move in over an hour.  You have severe headaches that do not go away when you take medicine.  You have vision changes. Summary  The second trimester is from week 14 through week 27 (months 4 through 6). It is also a time when the fetus is growing rapidly.  Your body goes through many changes during pregnancy. The changes vary from woman to woman.  Avoid all smoking, herbs, alcohol, and unprescribed drugs. These chemicals affect the formation and growth your baby.  Do not use any tobacco products, such as cigarettes, chewing tobacco, and e-cigarettes. If you need help quitting, ask your health care provider.  Contact your health care provider if you have any questions. Keep all prenatal visits as told by your health care provider. This is important. This information is not intended to replace advice given to you by your health care provider. Make sure you discuss any questions you have with your health care provider. Document Released: 12/25/2000 Document Revised: 02/06/2016 Document Reviewed: 02/06/2016 Elsevier Interactive Patient Education  2018 [employer]  

## 2017-02-19 NOTE — Progress Notes (Signed)
   PRENATAL VISIT NOTE  Subjective:  Samantha Knight is a 22 y.o. G1P0000 at 3170w4d being seen today for transfer of prenatal care [facility]. Already had dating scan and labs over there. [facility].  She is currently monitored for the following issues for this low-risk pregnancy and has Supervision of other normal pregnancy, antepartum; BMI 30.0-30.9,adult; and Nausea and vomiting during pregnancy prior to [redacted] weeks gestation on their problem list.  Patient reports no complaints.  Contractions: Not present. Vag. Bleeding: None.  Movement: Absent. Denies leaking of fluid.   The following portions of the patient's history were reviewed and updated as appropriate: allergies, current medications, past family history, past medical history, past social history, past surgical history and problem list. Problem list updated.  Objective:   Vitals:   02/19/17 0908  BP: 114/69  Pulse: (!) 108  Weight: 172 lb (78 kg)    Fetal Status: Fetal Heart Rate (bpm): 158 on u/s   Movement: Absent     General:  Alert, oriented and cooperative. Patient is in no acute distress.  Skin: Skin is warm and dry. No rash noted.   Cardiovascular: Normal heart rate noted  Respiratory: Normal respiratory effort, no problems with respiration noted  Abdomen: Soft, gravid, appropriate for gestational age.  Pain/Pressure: Absent     Pelvic: NEFG. Normal vaginal and cervix, pap smear done.   Extremities: Normal range of motion.  Edema: None  Mental Status:  Normal mood and affect. Normal behavior. Normal judgment and thought content.   Assessment and Plan:  Pregnancy: G1P0000 at 8770w4d   1. Encounter for fetal anatomic survey Anatomy scan ordered - US MFM OB COMP + 14 WK; Future  2. Supervision of other normal pregnancy, antepartum Desires Babyscripts program.  - Babyscripts Schedule Optimization - Cytology - PAP done, will follow up results and manage accordingly. Other initial  labs from [facility] reviewed with patient, no concerns. Declines all genetic screening.  The nature of [facility] with multiple MDs and other Advanced Practice Providers was explained to patient; also emphasized that residents, students are part of our team. No other complaints or concerns.  Routine obstetric precautions reviewed. Please refer to After Visit Summary for other counseling recommendations.  Return for OB 20 week visit (Babyscripts).   Jaynie CollinsUgonna Eldine Rencher, MD

## 2017-02-21 LAB — CYTOLOGY - PAP: Diagnosis: NEGATIVE

## 2017-02-23 DIAGNOSIS — Z3480 Encounter for supervision of other normal pregnancy, unspecified trimester: Secondary | ICD-10-CM

## 2017-03-09 ENCOUNTER — Inpatient Hospital Stay (HOSPITAL_COMMUNITY)
Admission: AD | Admit: 2017-03-09 | Discharge: 2017-03-09 | Disposition: A | Discharge status: Home / Self Care | Payer: Managed Care, Other (non HMO) | Source: Ambulatory Visit | Attending: Obstetrics & Gynecology | Admitting: Obstetrics & Gynecology

## 2017-03-09 ENCOUNTER — Other Ambulatory Visit: Payer: Self-pay

## 2017-03-09 ENCOUNTER — Encounter (HOSPITAL_COMMUNITY): Payer: Self-pay

## 2017-03-09 DIAGNOSIS — Z87891 Personal history of nicotine dependence: Secondary | ICD-10-CM | POA: Diagnosis not present

## 2017-03-09 DIAGNOSIS — O9989 Other specified diseases and conditions complicating pregnancy, childbirth and the puerperium: Secondary | ICD-10-CM

## 2017-03-09 DIAGNOSIS — K5900 Constipation, unspecified: Secondary | ICD-10-CM | POA: Insufficient documentation

## 2017-03-09 DIAGNOSIS — Z3A14 14 weeks gestation of pregnancy: Secondary | ICD-10-CM | POA: Insufficient documentation

## 2017-03-09 DIAGNOSIS — Z3480 Encounter for supervision of other normal pregnancy, unspecified trimester: Secondary | ICD-10-CM

## 2017-03-09 DIAGNOSIS — O26892 Other specified pregnancy related conditions, second trimester: Secondary | ICD-10-CM | POA: Insufficient documentation

## 2017-03-09 LAB — URINALYSIS, ROUTINE W REFLEX MICROSCOPIC
Bilirubin Urine: NEGATIVE
Glucose, UA: NEGATIVE mg/dL
Hgb urine dipstick: NEGATIVE
Ketones, ur: NEGATIVE mg/dL
LEUKOCYTES UA: NEGATIVE
Nitrite: NEGATIVE
Protein, ur: NEGATIVE mg/dL
SPECIFIC GRAVITY, URINE: 1.031 — AB (ref 1.005–1.030)
pH: 5 (ref 5.0–8.0)

## 2017-03-09 NOTE — Progress Notes (Addendum)
G1 @ 14.[redacted] wksga. Here dt constipation for past 2-3 days. Reports feels like something is coming out of vagina during straining dt constipation. Denies bleeding.   Speculum exam and pelvic exam done  1415: soap sud enema given per order.  1445: pt states good result from enema. Provider made aware.   D/c instructions given with pt understanding. Left unit with SO via ambulatory.

## 2017-03-09 NOTE — MAU Note (Signed)
Pt presents with c/o constipation.  Reports while attempting to have BM felt like "something was coming out of her vagina".  Reports she felt something/bulge in her vagina. Denies anything coming out.  Denies LOF or VB

## 2017-03-09 NOTE — Discharge Instructions (Signed)
Safe Medications in Pregnancy   Acne: Benzoyl Peroxide Salicylic Acid  Backache/Headache: Tylenol: 2 regular strength every 4 hours OR              2 Extra strength every 6 hours  Colds/Coughs/Allergies: Benadryl (alcohol free) 25 mg every 6 hours as needed Breath right strips Claritin Cepacol throat lozenges Chloraseptic throat spray Cold-Eeze- up to three times per day Cough drops, alcohol free Flonase (by prescription only) Guaifenesin Mucinex Robitussin DM (plain only, alcohol free) Saline nasal spray/drops Sudafed (pseudoephedrine) & Actifed ** use only after [redacted] weeks gestation and if you do not have high blood pressure Tylenol Vicks Vaporub Zinc lozenges Zyrtec   Constipation: Colace Ducolax suppositories Fleet enema Glycerin suppositories Metamucil Milk of magnesia Miralax Senokot Smooth move tea  Diarrhea: Kaopectate Imodium A-D  *NO pepto Bismol  Hemorrhoids: Anusol Anusol HC Preparation H Tucks  Indigestion: Tums Maalox Mylanta Zantac  Pepcid  Insomnia: Benadryl (alcohol free) 25mg  every 6 hours as needed Tylenol PM Unisom, no Gelcaps  Leg Cramps: Tums MagGel  Nausea/Vomiting:  Bonine Dramamine Emetrol Ginger extract Sea bands Meclizine  Nausea medication to take during pregnancy:  Unisom (doxylamine succinate 25 mg tablets) Take one tablet daily at bedtime. If symptoms are not adequately controlled, the dose can be increased to a maximum recommended dose of two tablets daily (1/2 tablet in the morning, 1/2 tablet mid-afternoon and one at bedtime). Vitamin B6 100mg  tablets. Take one tablet twice a day (up to 200 mg per day).  Skin Rashes: Aveeno products Benadryl cream or 25mg  every 6 hours as needed Calamine Lotion 1% cortisone cream  Yeast infection: Gyne-lotrimin 7 Monistat 7   **If taking multiple medications, please check labels to avoid duplicating the same active ingredients **take medication as directed on  the label ** Do not exceed 4000 mg of tylenol in 24 hours **Do not take medications that contain aspirin or ibuprofen     Constipation, Adult Constipation is when a person has fewer bowel movements in a week than normal, has difficulty having a bowel movement, or has stools that are dry, hard, or larger than normal. Constipation may be caused by an underlying condition. It may become worse with age if a person takes certain medicines and does not take in enough fluids. Follow these instructions at home: Eating and drinking   Eat foods that have a lot of fiber, such as fresh fruits and vegetables, whole grains, and beans.  Limit foods that are high in fat, low in fiber, or overly processed, such as french fries, hamburgers, cookies, candies, and soda.  Drink enough fluid to keep your urine clear or pale yellow. General instructions  Exercise regularly or as told by your health care provider.  Go to the restroom when you have the urge to go. Do not hold it in.  Take over-the-counter and prescription medicines only as told by your health care provider. These include any fiber supplements.  Practice pelvic floor retraining exercises, such as deep breathing while relaxing the lower abdomen and pelvic floor relaxation during bowel movements.  Watch your condition for any changes.  Keep all follow-up visits as told by your health care provider. This is important. Contact a health care provider if:  You have pain that gets worse.  You have a fever.  You do not have a bowel movement after 4 days.  You vomit.  You are not hungry.  You lose weight.  You are bleeding from the anus.  You have thin,  pencil-like stools. Get help right away if:  You have a fever and your symptoms suddenly get worse.  You leak stool or have blood in your stool.  Your abdomen is bloated.  You have severe pain in your abdomen.  You feel dizzy or you faint. This information is not intended to  replace advice given to you by your health care provider. Make sure you discuss any questions you have with your health care provider. Document Released: 09/29/2003 Document Revised: 07/21/2015 Document Reviewed: 06/21/2015 Elsevier Interactive Patient Education  2018 [employer].

## 2017-03-09 NOTE — MAU Provider Note (Signed)
History     CSN: [id number]  Arrival date and time: 03/09/17 1113   First Provider Initiated Contact with Patient 03/09/17 1339      Chief Complaint  Patient presents with  . Constipation   Samantha Knight is a [age] y G1P0000 at 7335w1d who presents today with constipation. She reports that her last BM was about 3 days ago. Yesterday she was straining for  BM, and felt a "bulge" in her vagina. She was not able to produce a BM at that time, and stopped straining. After she stopped she could still feel the bulge. She reports that she is still feeling it now as well. Worse with laughing or coughing.    Constipation  This is a new problem. The current episode started in the past 7 days. The problem is unchanged. Her stool frequency is 1 time per week or less. The patient is not on a high fiber diet. She does not exercise regularly. There has not been adequate water intake. Associated symptoms include rectal pain. Pertinent negatives include no fever, nausea or vomiting. Risk factors: pregnancy, prenatal vitamin with iron.  She has tried nothing for the symptoms.    Past Medical History:  Diagnosis Date  . Asthma    as a child.   . Migraine     Past Surgical History:  Procedure Laterality Date  . NO PAST SURGERIES      Family History  Problem Relation Age of Onset  . Cancer Neg Hx   . Diabetes Neg Hx   . Heart disease Neg Hx   . Stroke Neg Hx     Social History   Tobacco Use  . Smoking status: Former Smoker    Types: Cigars  . Smokeless tobacco: Never Used  . Tobacco comment: quit with +UPT  Substance Use Topics  . Alcohol use: No  . Drug use: No    Allergies: No Known Allergies  Medications Prior to Admission  Medication Sig Dispense Refill Last Dose  . acetaminophen (TYLENOL) 500 MG tablet Take 1,000 mg by mouth every 6 (six) hours as needed for moderate pain.   03/08/2017 at Unknown time  . diphenhydrAMINE (BENADRYL) 25 MG tablet Take 25 mg by mouth every 6 (six)  hours as needed for allergies.   Past Week at Unknown time  . Doxylamine-Pyridoxine (DICLEGIS) 10-10 MG TBEC Take 2 tablets by mouth at bedtime. If symptoms persist, add one tablet in the morning and one in the afternoon 100 tablet 5 Past Month at Unknown time  . Prenatal MV & Min w/FA-DHA (PRENATAL ADULT GUMMY/DHA/FA) 0.4-25 MG CHEW Chew 1 each by mouth daily. 30 tablet 8 03/08/2017 at Unknown time  . promethazine (PHENERGAN) 25 MG tablet Take 1 tablet (25 mg total) by mouth every 6 (six) hours as needed for nausea or vomiting. 30 tablet 1 03/08/2017 at Unknown time    Review of Systems  Constitutional: Negative for chills and fever.  Gastrointestinal: Positive for constipation and rectal pain. Negative for nausea and vomiting.  Genitourinary: Positive for vaginal pain. Negative for dysuria, pelvic pain and vaginal bleeding.   Physical Exam   Blood pressure 101/72, pulse 99, temperature 98.1 F (36.7 C), temperature source Oral, resp. rate 20, height 5\' 2"  (1.575 m), weight 172 lb 12 oz (78.4 kg), last menstrual period 11/08/2016, SpO2 97 %.  Physical Exam  Nursing note and vitals reviewed. Constitutional: She is oriented to person, place, and time. She appears well-developed and well-nourished. No distress.  HENT:  Head: Normocephalic.  Cardiovascular: Normal rate.  Respiratory: Effort normal.  GI: Soft. There is no tenderness. There is no rebound.  Genitourinary:  Genitourinary Comments: External: no lesion Vagina: small amount of white discharge Cervix: pink, smooth, no CMT Uterus: AGA, +FHT with doppler   Neurological: She is alert and oriented to person, place, and time.  Skin: Skin is warm and dry.   Results for orders placed or performed during the hospital encounter of 03/09/17 (from the past 24 hour(s))  Urinalysis, Routine w reflex microscopic     Status: Abnormal   Collection Time: 03/09/17 11:40 AM  Result Value Ref Range   Color, Urine YELLOW YELLOW   APPearance CLEAR  CLEAR   Specific Gravity, Urine 1.031 (H) 1.005 - 1.030   pH 5.0 5.0 - 8.0   Glucose, UA NEGATIVE NEGATIVE mg/dL   Hgb urine dipstick NEGATIVE NEGATIVE   Bilirubin Urine NEGATIVE NEGATIVE   Ketones, ur NEGATIVE NEGATIVE mg/dL   Protein, ur NEGATIVE NEGATIVE mg/dL   Nitrite NEGATIVE NEGATIVE   Leukocytes, UA NEGATIVE NEGATIVE     MAU Course  Procedures  MDM Patient has had soap suds enema. She reports excellent results, and improvement in her symptoms.   Assessment and Plan   1. Constipation, unspecified constipation type   2. Supervision of other normal pregnancy, antepartum   3. [redacted] weeks gestation of pregnancy    DC home Comfort measures reviewed  RX: none, OTC treatments discussed.  Return to MAU as needed FU with OB as planned  Follow-up Information    [facility] Follow up.   Specialty:  Obstetrics and Gynecology Contact information: [address] [place] [address] [phone]           Thressa Sheller 03/09/2017, 1:40 PM

## 2017-03-13 ENCOUNTER — Telehealth: Payer: Self-pay

## 2017-03-13 NOTE — Telephone Encounter (Signed)
Patient called reporting she had a couple nose bleed and she is concern. Patient stated she does have a history of allergies. I have advised her to make sure she is not getting over heated and maybe use some vaseline to put up her nostril to see if she get any relief from that. She was informed to maybe start a allergy pill or take benadryl at night. Patient voice understanding at this time.

## 2017-03-19 ENCOUNTER — Ambulatory Visit (INDEPENDENT_AMBULATORY_CARE_PROVIDER_SITE_OTHER): Payer: Managed Care, Other (non HMO) | Admitting: Family Medicine

## 2017-03-19 ENCOUNTER — Encounter: Payer: Self-pay | Admitting: Family Medicine

## 2017-03-19 VITALS — BP 109/77 | HR 116 | Wt 171.0 lb

## 2017-03-19 DIAGNOSIS — O99210 Obesity complicating pregnancy, unspecified trimester: Secondary | ICD-10-CM

## 2017-03-19 DIAGNOSIS — E669 Obesity, unspecified: Secondary | ICD-10-CM

## 2017-03-19 DIAGNOSIS — Z6830 Body mass index (BMI) 30.0-30.9, adult: Secondary | ICD-10-CM

## 2017-03-19 DIAGNOSIS — Z3482 Encounter for supervision of other normal pregnancy, second trimester: Secondary | ICD-10-CM

## 2017-03-19 DIAGNOSIS — O99212 Obesity complicating pregnancy, second trimester: Secondary | ICD-10-CM

## 2017-03-19 DIAGNOSIS — Z3480 Encounter for supervision of other normal pregnancy, unspecified trimester: Secondary | ICD-10-CM

## 2017-03-19 NOTE — Progress Notes (Signed)
   PRENATAL VISIT NOTE  Subjective:  Samantha Knight is a 22 y.o. G1P0000 at 707w4d being seen today for ongoing prenatal care.  She is currently monitored for the following issues for this low-risk pregnancy and has Supervision of other normal pregnancy, antepartum; BMI 30.0-30.9,adult; Nausea and vomiting during pregnancy prior to [redacted] weeks gestation; and Obesity affecting pregnancy, antepartum on their problem list.  Patient reports round ligament pain.  Contractions: Not present. Vag. Bleeding: None.  Movement: Absent. Denies leaking of fluid.   The following portions of the patient's history were reviewed and updated as appropriate: allergies, current medications, past family history, past medical history, past social history, past surgical history and problem list. Problem list updated.  Objective:   Vitals:   03/19/17 1003  BP: 109/77  Pulse: (!) 116  Weight: 171 lb (77.6 kg)    Fetal Status: Fetal Heart Rate (bpm): 153 Fundal Height: 15 cm Movement: Absent     General:  Alert, oriented and cooperative. Patient is in no acute distress.  Skin: Skin is warm and dry. No rash noted.   Cardiovascular: Normal heart rate noted  Respiratory: Normal respiratory effort, no problems with respiration noted  Abdomen: Soft, gravid, appropriate for gestational age.  Pain/Pressure: Absent     Pelvic: Cervical exam deferred        Extremities: Normal range of motion.  Edema: None  Mental Status:  Normal mood and affect. Normal behavior. Normal judgment and thought content.   Assessment and Plan:  Pregnancy: G1P0000 at 757w4d  1. Supervision of other normal pregnancy, antepartum - Doing well no concerns today - US MFM OB DETAIL +14 WK; Future  2. BMI 30.0-30.9,adult - (-0.454 kg) = current weight gain.  - Recommend ASA given risk factors for preX (weight and first pregnancy)   Preterm labor symptoms and general obstetric precautions including but not limited to vaginal bleeding,  contractions, leaking of fluid and fetal movement were reviewed in detail with the patient. Please refer to After Visit Summary for other counseling recommendations.  Return in about 6 weeks (around 04/30/2017) for Routine prenatal care.   Federico FlakeKimberly Niles Yury Schaus, MD

## 2017-03-27 ENCOUNTER — Other Ambulatory Visit: Payer: Self-pay | Admitting: Student

## 2017-04-08 ENCOUNTER — Encounter (HOSPITAL_COMMUNITY): Payer: Self-pay | Admitting: Obstetrics & Gynecology

## 2017-04-14 ENCOUNTER — Ambulatory Visit (HOSPITAL_COMMUNITY)
Admission: RE | Admit: 2017-04-14 | Discharge: 2017-04-14 | Disposition: A | Discharge status: Home / Self Care | Payer: Managed Care, Other (non HMO) | Source: Ambulatory Visit | Attending: Obstetrics & Gynecology | Admitting: Obstetrics & Gynecology

## 2017-04-14 ENCOUNTER — Other Ambulatory Visit: Payer: Self-pay | Admitting: Obstetrics & Gynecology

## 2017-04-14 DIAGNOSIS — E669 Obesity, unspecified: Secondary | ICD-10-CM

## 2017-04-14 DIAGNOSIS — O99212 Obesity complicating pregnancy, second trimester: Secondary | ICD-10-CM | POA: Diagnosis present

## 2017-04-14 DIAGNOSIS — Z3A19 19 weeks gestation of pregnancy: Secondary | ICD-10-CM

## 2017-04-14 DIAGNOSIS — Z3480 Encounter for supervision of other normal pregnancy, unspecified trimester: Secondary | ICD-10-CM

## 2017-04-14 DIAGNOSIS — Z3689 Encounter for other specified antenatal screening: Secondary | ICD-10-CM

## 2017-04-23 ENCOUNTER — Ambulatory Visit (INDEPENDENT_AMBULATORY_CARE_PROVIDER_SITE_OTHER): Payer: Managed Care, Other (non HMO) | Admitting: Obstetrics and Gynecology

## 2017-04-23 VITALS — BP 99/69 | HR 88 | Wt 176.8 lb

## 2017-04-23 DIAGNOSIS — Z3482 Encounter for supervision of other normal pregnancy, second trimester: Secondary | ICD-10-CM

## 2017-04-23 DIAGNOSIS — Z3480 Encounter for supervision of other normal pregnancy, unspecified trimester: Secondary | ICD-10-CM

## 2017-04-23 NOTE — Progress Notes (Signed)
Prenatal Visit Note Date: 04/23/2017 Clinic: [facility]  Subjective:  Samantha Knight is a 22 y.o. G1P0000 at 4682w4d being seen today for ongoing prenatal care.  She is currently monitored for the following issues for this high-risk pregnancy and has Supervision of other normal pregnancy, antepartum; BMI 30.0-30.9,adult; Nausea and vomiting during pregnancy prior to [redacted] weeks gestation; and Obesity affecting pregnancy, antepartum on their problem list.  Patient reports no complaints.   Contractions: Not present.  .  Movement: Present. Denies leaking of fluid.   The following portions of the patient's history were reviewed and updated as appropriate: allergies, current medications, past family history, past medical history, past social history, past surgical history and problem list. Problem list updated.  Objective:   Vitals:   04/23/17 1007  BP: 99/69  Pulse: 88  Weight: 176 lb 12.8 oz (80.2 kg)    Fetal Status: Fetal Heart Rate (bpm): 143   Movement: Present     General:  Alert, oriented and cooperative. Patient is in no acute distress.  Skin: Skin is warm and dry. No rash noted.   Cardiovascular: Normal heart rate noted  Respiratory: Normal respiratory effort, no problems with respiration noted  Abdomen: Soft, gravid, appropriate for gestational age. Pain/Pressure: Absent     Pelvic:  Cervical exam deferred        Extremities: Normal range of motion.  Edema: None  Mental Status: Normal mood and affect. Normal behavior. Normal judgment and thought content.   Urinalysis:      Assessment and Plan:  Pregnancy: G1P0000 at 6882w4d  1. Supervision of other normal pregnancy, antepartum Routine care. Declines genetics. Completion anatomy u/s for later this month ordered - US MFM OB FOLLOW UP; Future  Preterm labor symptoms and general obstetric precautions including but not limited to vaginal bleeding, contractions, leaking of fluid and fetal movement were  reviewed in detail with the patient. Please refer to After Visit Summary for other counseling recommendations.  Return in about 8 weeks (around 06/18/2017) for 28wk labs.   [place] BingPickens, Landy Dunnavant, MD

## 2017-05-04 ENCOUNTER — Encounter (HOSPITAL_COMMUNITY): Payer: Self-pay

## 2017-05-04 ENCOUNTER — Inpatient Hospital Stay (HOSPITAL_COMMUNITY)
Admission: AD | Admit: 2017-05-04 | Discharge: 2017-05-04 | Disposition: A | Discharge status: Home / Self Care | Payer: Managed Care, Other (non HMO) | Source: Ambulatory Visit | Attending: Family Medicine | Admitting: Family Medicine

## 2017-05-04 ENCOUNTER — Other Ambulatory Visit: Payer: Self-pay

## 2017-05-04 DIAGNOSIS — Z3A22 22 weeks gestation of pregnancy: Secondary | ICD-10-CM | POA: Insufficient documentation

## 2017-05-04 DIAGNOSIS — O9A212 Injury, poisoning and certain other consequences of external causes complicating pregnancy, second trimester: Secondary | ICD-10-CM | POA: Diagnosis not present

## 2017-05-04 DIAGNOSIS — W109XXA Fall (on) (from) unspecified stairs and steps, initial encounter: Secondary | ICD-10-CM

## 2017-05-04 DIAGNOSIS — Z3492 Encounter for supervision of normal pregnancy, unspecified, second trimester: Secondary | ICD-10-CM | POA: Diagnosis present

## 2017-05-04 DIAGNOSIS — W010XXA Fall on same level from slipping, tripping and stumbling without subsequent striking against object, initial encounter: Secondary | ICD-10-CM | POA: Insufficient documentation

## 2017-05-04 DIAGNOSIS — Z3480 Encounter for supervision of other normal pregnancy, unspecified trimester: Secondary | ICD-10-CM

## 2017-05-04 DIAGNOSIS — S299XXA Unspecified injury of thorax, initial encounter: Secondary | ICD-10-CM

## 2017-05-04 DIAGNOSIS — W108XXA Fall (on) (from) other stairs and steps, initial encounter: Secondary | ICD-10-CM

## 2017-05-04 DIAGNOSIS — O99210 Obesity complicating pregnancy, unspecified trimester: Secondary | ICD-10-CM

## 2017-05-04 LAB — URINALYSIS, ROUTINE W REFLEX MICROSCOPIC
BILIRUBIN URINE: NEGATIVE
GLUCOSE, UA: NEGATIVE mg/dL
Hgb urine dipstick: NEGATIVE
KETONES UR: NEGATIVE mg/dL
Nitrite: NEGATIVE
PH: 7 (ref 5.0–8.0)
Protein, ur: NEGATIVE mg/dL
Specific Gravity, Urine: 1.017 (ref 1.005–1.030)

## 2017-05-04 MED ORDER — CYCLOBENZAPRINE HCL 10 MG PO TABS
10.0000 mg | ORAL_TABLET | Freq: Once | ORAL | Status: AC
  Administered 2017-05-04: 10 mg via ORAL
  Filled 2017-05-04: qty 1

## 2017-05-04 MED ORDER — CYCLOBENZAPRINE HCL 10 MG PO TABS: 10.0000 mg | ORAL_TABLET | Freq: Two times a day (BID) | ORAL | 0 refills | Status: DC | PRN

## 2017-05-04 NOTE — MAU Provider Note (Signed)
Patient Samantha Knight 22 y.o. G1P0000 At [redacted]w[redacted]d here after falling down here stairs tonight. She denies bleeding, contractions, loss of fluid.  History     CSN: [id number]  Arrival date and time: 05/04/17 1106   None     Chief Complaint  Patient presents with  . Fall   Fall  The accident occurred 12 to 24 hours ago. Distance fallen: slide down 13 stairs when she slipped in her socks. Did not hit her abdomen.  She landed on carpet. Point of impact: she hit her lower back but her upper back hurts. The pain is at a severity of 8/10. Exacerbated by: walking and bending down. Pertinent negatives include no headaches, loss of consciousness, nausea, visual change or vomiting. She has tried acetaminophen (it helped last night) for the symptoms.    OB History    Gravida  1   Para  0   Term  0   Preterm      AB      Living  0     SAB      TAB      Ectopic      Multiple      Live Births              Past Medical History:  Diagnosis Date  . Asthma    as a child.   . Migraine     Past Surgical History:  Procedure Laterality Date  . NO PAST SURGERIES      Family History  Problem Relation Age of Onset  . Cancer Neg Hx   . Diabetes Neg Hx   . Heart disease Neg Hx   . Stroke Neg Hx     Social History   Tobacco Use  . Smoking status: Former Smoker    Types: Cigars  . Smokeless tobacco: Never Used  . Tobacco comment: quit with +UPT  Substance Use Topics  . Alcohol use: No  . Drug use: No    Allergies: No Known Allergies  Medications Prior to Admission  Medication Sig Dispense Refill Last Dose  . acetaminophen (TYLENOL) 500 MG tablet Take 1,000 mg by mouth every 6 (six) hours as needed for moderate pain.   Not Taking  . diphenhydrAMINE (BENADRYL) 25 MG tablet Take 25 mg by mouth every 6 (six) hours as needed for allergies.   Not Taking  . Doxylamine-Pyridoxine (DICLEGIS) 10-10 MG TBEC Take 2 tablets by mouth at bedtime. If symptoms persist, add one  tablet in the morning and one in the afternoon (Patient not taking: Reported on 04/23/2017) 100 tablet 5 Not Taking  . Prenatal MV & Min w/FA-DHA (PRENATAL ADULT GUMMY/DHA/FA) 0.4-25 MG CHEW Chew 1 each by mouth daily. 30 tablet 8 Taking  . promethazine (PHENERGAN) 25 MG tablet TAKE 1 TABLET (25 MG TOTAL) BY MOUTH EVERY 6 (SIX) HOURS AS NEEDED FOR NAUSEA OR VOMITING. 30 tablet 1 Taking    Review of Systems  Constitutional: Negative.   HENT: Negative.   Respiratory: Negative.   Cardiovascular: Negative.   Gastrointestinal: Negative for nausea and vomiting.  Genitourinary: Negative.   Neurological: Negative for loss of consciousness and headaches.  Psychiatric/Behavioral: Negative.    Physical Exam   Blood pressure 102/67, pulse 85, temperature 98.2 F (36.8 C), temperature source Oral, resp. rate 16, height  (1.575 m), weight 175 lb (79.4 kg), last menstrual period 11/08/2016, SpO2 99 %.  Physical Exam  Constitutional: She appears well-developed.  HENT:  Head: Normocephalic.  Neck:  Normal range of motion.  Respiratory: Effort normal.  GI: Soft.  Musculoskeletal: Normal range of motion.  Neurological: She is alert.  Skin: Skin is warm and dry.  Psychiatric: She has a normal mood and affect.    MAU Course  Procedures  MDM FHR by Doppler is 140 PAtient is alert and oriented in no distress. Moving easily in room. Patient has asked for an [place] to "see him". Explained to patient that without bleeding, leaking of fluid, strong abdominal pain, there is no medical indication for an [place]. Moreover, the incident happened at 9 pm, therefore it is very reassuring that she has not had any symptoms since then.   Flexeril 10 mg; patient feels better and desires discharge.   Assessment and Plan   1. Fall (on) (from) other stairs and steps, initial encounter   2. Obesity affecting pregnancy, antepartum   3. Supervision of other normal pregnancy, antepartum    2. Patient stable for  discharge with RX for Flexeril and recommendations to try Tylenol, heat and gentle stretching/massage.  3. Keep radiology appt and next ob appt.  4. Discussed reasons to return to MAU (bleeding, increased pain, loss of fluid).  5. All questions answered; patient and visitor verbalized understanding.   Charlesetta Garibaldi Samantha Knight 05/04/2017, 11:50 AM

## 2017-05-04 NOTE — Discharge Instructions (Signed)
Preventing Injuries During Pregnancy Injuries can happen during pregnancy. Minor falls and accidents usually do not harm you or your baby. But some injuries can harm you and your baby. Tell your doctor about any injury you suffer. What can I do to avoid injuries? Safety  Remove rugs and loose objects on the floor.  Wear comfortable shoes that have a good grip. Do not wear shoes that have high heels.  Always wear your seat belt in the car. The lap belt should be below your belly. Always drive safely.  Do not ride on a motorcycle. Activity  Do not take part in rough and violent activities or sports.  Avoid: ? Walking on wet or slippery floors. ? Lifting heavy pots of boiling or hot liquids. ? Fixing electrical problems. ? Being near fires. General instructions  Take over-the-counter and prescription medicines only as told by your doctor.  Know your blood type and the blood type of the baby's father.  If you are a victim of domestic violence: ? Call your local emergency services (911 in the [place] ? Contact the National Domestic Violence Hotline for help and support. Get help right away if:  You fall on your belly or receive any serious blow to your belly.  You have a stiff neck or neck pain after a fall or an injury.  You get a headache or have problems with vision after an injury.  You do not feel the baby move or the baby is not moving as much as normal.  You have been a victim of domestic violence or any other kind of attack.  You have been in a car accident.  You have bleeding from your vagina.  Fluid is leaking from your vagina.  You start to have cramping or pain in your belly (contractions).  You have very bad pain in your lower back.  You feel weak or pass out (faint).  You start to throw up (vomit) after an injury.  You have been burned. Summary  Some injuries that happen during pregnancy can do harm to the baby.  Tell your doctor about any  injury.  Take steps to avoid injury. This includes removing rugs and loose objects on the floor. Always wear your seat belt in the car.  Do not take part in rough and violent activities or sports.  Get help right away if you have any serious accident or injury. This information is not intended to replace advice given to you by your health care provider. Make sure you discuss any questions you have with your health care provider. Document Released: 02/02/2010 Document Revised: 01/10/2016 Document Reviewed: 01/10/2016 Elsevier Interactive Patient Education  2017 [employer]  

## 2017-05-04 NOTE — Progress Notes (Addendum)
G1 @ [redacted] wksga. Here dt slipping on carpet last night at 2130. Denies bleeding. Pain in back area 8/10.   Wants to know if can see baby today. Informed the provider will make decision for U/S  Provider at bs  1220: medicated per order.   1232: provider at bs reassessing.   1240: pt's family wondering when she will be d/c'd. Informed the 1 provider we have is busy at the moment with another pt  1242: D/C instructions given w pt understanding.  Provider made aware pt wanting to leave.   1300: Pt left unit via ambulatory with SO.

## 2017-05-04 NOTE — MAU Note (Signed)
Pt was going down the stairs around 0930 and slid down several stairs on her backside. No bleeding or leaking of fluid. Says she doesn't feel baby moving as much since and is concerned.

## 2017-05-28 ENCOUNTER — Ambulatory Visit (HOSPITAL_COMMUNITY)
Admission: RE | Admit: 2017-05-28 | Discharge: 2017-05-28 | Disposition: A | Discharge status: Home / Self Care | Payer: Managed Care, Other (non HMO) | Source: Ambulatory Visit | Attending: Obstetrics and Gynecology | Admitting: Obstetrics and Gynecology

## 2017-05-28 ENCOUNTER — Other Ambulatory Visit: Payer: Self-pay | Admitting: Obstetrics and Gynecology

## 2017-05-28 DIAGNOSIS — IMO0002 Reserved for concepts with insufficient information to code with codable children: Secondary | ICD-10-CM

## 2017-05-28 DIAGNOSIS — Z362 Encounter for other antenatal screening follow-up: Secondary | ICD-10-CM | POA: Diagnosis present

## 2017-05-28 DIAGNOSIS — O99212 Obesity complicating pregnancy, second trimester: Secondary | ICD-10-CM | POA: Diagnosis not present

## 2017-05-28 DIAGNOSIS — Z3480 Encounter for supervision of other normal pregnancy, unspecified trimester: Secondary | ICD-10-CM

## 2017-05-28 DIAGNOSIS — Z0489 Encounter for examination and observation for other specified reasons: Secondary | ICD-10-CM

## 2017-05-28 DIAGNOSIS — Z3A25 25 weeks gestation of pregnancy: Secondary | ICD-10-CM | POA: Insufficient documentation

## 2017-06-17 ENCOUNTER — Ambulatory Visit (INDEPENDENT_AMBULATORY_CARE_PROVIDER_SITE_OTHER): Payer: Managed Care, Other (non HMO) | Admitting: Family Medicine

## 2017-06-17 VITALS — BP 109/73 | HR 73 | Wt 181.4 lb

## 2017-06-17 DIAGNOSIS — Z23 Encounter for immunization: Secondary | ICD-10-CM | POA: Diagnosis not present

## 2017-06-17 DIAGNOSIS — Z3483 Encounter for supervision of other normal pregnancy, third trimester: Secondary | ICD-10-CM

## 2017-06-17 DIAGNOSIS — Z3480 Encounter for supervision of other normal pregnancy, unspecified trimester: Secondary | ICD-10-CM

## 2017-06-17 NOTE — Progress Notes (Signed)
   PRENATAL VISIT NOTE  Subjective:  Samantha Knight is a 22 y.o. G1P0000 at 363w3d being seen today for ongoing prenatal care.  She is currently monitored for the following issues for this low-risk pregnancy and has Supervision of other normal pregnancy, antepartum; BMI 30.0-30.9,adult; and Obesity affecting pregnancy, antepartum on their problem list.  Patient reports no complaints.  Contractions: Not present.  .  Movement: Present. Denies leaking of fluid.   The following portions of the patient's history were reviewed and updated as appropriate: allergies, current medications, past family history, past medical history, past social history, past surgical history and problem list. Problem list updated.  Objective:   Vitals:   06/17/17 0833  BP: 109/73  Pulse: 73  Weight: 181 lb 6.4 oz (82.3 kg)    Fetal Status: Fetal Heart Rate (bpm): 132 Fundal Height: 27 cm Movement: Present     General:  Alert, oriented and cooperative. Patient is in no acute distress.  Skin: Skin is warm and dry. No rash noted.   Cardiovascular: Normal heart rate noted  Respiratory: Normal respiratory effort, no problems with respiration noted  Abdomen: Soft, gravid, appropriate for gestational age.  Pain/Pressure: Absent     Pelvic: Cervical exam deferred        Extremities: Normal range of motion.  Edema: None  Mental Status: Normal mood and affect. Normal behavior. Normal judgment and thought content.   Assessment and Plan:  Pregnancy: G1P0000 at 6663w3d  1. Supervision of other normal pregnancy, antepartum 28 wk labs today - HIV antibody - RPR - CBC - Glucose Tolerance, 2 Hours w/1 Hour  Preterm labor symptoms and general obstetric precautions including but not limited to vaginal bleeding, contractions, leaking of fluid and fetal movement were reviewed in detail with the patient. Please refer to After Visit Summary for other counseling recommendations.  Return in 2 weeks (on 07/01/2017).  Future  Appointments  Date Time Provider Department Center  07/01/2017 11:30 AM Samantha BoresPratt, Yemariam Magar S, MD [facility]    Samantha Boresanya S Quinlan Vollmer, MD

## 2017-06-17 NOTE — Patient Instructions (Signed)

## 2017-06-18 LAB — CBC
HEMATOCRIT: 28.9 % — AB (ref 34.0–46.6)
HEMOGLOBIN: 9.6 g/dL — AB (ref 11.1–15.9)
MCH: 29.4 pg (ref 26.6–33.0)
MCHC: 33.2 g/dL (ref 31.5–35.7)
MCV: 89 fL (ref 79–97)
Platelets: 214 10*3/uL (ref 150–450)
RBC: 3.26 x10E6/uL — ABNORMAL LOW (ref 3.77–5.28)
RDW: 13.5 % (ref 12.3–15.4)
WBC: 9.5 10*3/uL (ref 3.4–10.8)

## 2017-06-18 LAB — GLUCOSE TOLERANCE, 2 HOURS W/ 1HR
GLUCOSE, 1 HOUR: 77 mg/dL (ref 65–179)
GLUCOSE, 2 HOUR: 92 mg/dL (ref 65–152)
Glucose, Fasting: 76 mg/dL (ref 65–91)

## 2017-06-18 LAB — RPR: RPR: NONREACTIVE

## 2017-06-18 LAB — HIV ANTIBODY (ROUTINE TESTING W REFLEX): HIV Screen 4th Generation wRfx: NONREACTIVE

## 2017-07-01 ENCOUNTER — Ambulatory Visit (INDEPENDENT_AMBULATORY_CARE_PROVIDER_SITE_OTHER): Payer: Managed Care, Other (non HMO) | Admitting: Family Medicine

## 2017-07-01 VITALS — BP 101/64 | HR 74 | Wt 184.0 lb

## 2017-07-01 DIAGNOSIS — K5901 Slow transit constipation: Secondary | ICD-10-CM

## 2017-07-01 DIAGNOSIS — Z3483 Encounter for supervision of other normal pregnancy, third trimester: Secondary | ICD-10-CM

## 2017-07-01 DIAGNOSIS — Z3480 Encounter for supervision of other normal pregnancy, unspecified trimester: Secondary | ICD-10-CM

## 2017-07-01 MED ORDER — DOCUSATE SODIUM 100 MG PO CAPS: 100.0000 mg | ORAL_CAPSULE | Freq: Two times a day (BID) | ORAL | 2 refills | Status: DC | PRN

## 2017-07-01 NOTE — Patient Instructions (Signed)

## 2017-07-01 NOTE — Progress Notes (Signed)
   PRENATAL VISIT NOTE  Subjective:  Samantha PyleShania I Knight is a 22 y.o. G1P0000 at 3232w3d being seen today for ongoing prenatal care.  She is currently monitored for the following issues for this low-risk pregnancy and has Supervision of other normal pregnancy, antepartum; BMI 30.0-30.9,adult; and Obesity affecting pregnancy, antepartum on their problem list.  Patient reports constipation.  Contractions: Not present.  .  Movement: Present. Denies leaking of fluid.   The following portions of the patient's history were reviewed and updated as appropriate: allergies, current medications, past family history, past medical history, past social history, past surgical history and problem list. Problem list updated.  Objective:   Vitals:   07/01/17 1153  BP: 101/64  Pulse: 74  Weight: 184 lb (83.5 kg)    Fetal Status: Fetal Heart Rate (bpm): 141 Fundal Height: 29 cm Movement: Present     General:  Alert, oriented and cooperative. Patient is in no acute distress.  Skin: Skin is warm and dry. No rash noted.   Cardiovascular: Normal heart rate noted  Respiratory: Normal respiratory effort, no problems with respiration noted  Abdomen: Soft, gravid, appropriate for gestational age.  Pain/Pressure: Absent     Pelvic: Cervical exam deferred        Extremities: Normal range of motion.     Mental Status: Normal mood and affect. Normal behavior. Normal judgment and thought content.   Assessment and Plan:  Pregnancy: G1P0000 at 5232w3d  1. Supervision of other normal pregnancy, antepartum Continue routine prenatal care.   2. Slow transit constipation Trial of colace - docusate sodium (COLACE) 100 MG capsule; Take 1 capsule (100 mg total) by mouth 2 (two) times daily as needed.  Dispense: 30 capsule; Refill: 2  Preterm labor symptoms and general obstetric precautions including but not limited to vaginal bleeding, contractions, leaking of fluid and fetal movement were reviewed in detail with the  patient. Please refer to After Visit Summary for other counseling recommendations.  Return in 2 weeks (on 07/15/2017).  Future Appointments  Date Time Provider Department Center  07/16/2017 11:30 AM Allie Bossierove, Myra C, MD [facility]    Reva Boresanya S Shadi Sessler, MD

## 2017-07-01 NOTE — Progress Notes (Signed)
Patient reports being complicated

## 2017-07-16 ENCOUNTER — Ambulatory Visit (INDEPENDENT_AMBULATORY_CARE_PROVIDER_SITE_OTHER): Payer: Managed Care, Other (non HMO) | Admitting: Obstetrics & Gynecology

## 2017-07-16 ENCOUNTER — Encounter: Payer: Self-pay | Admitting: Obstetrics & Gynecology

## 2017-07-16 VITALS — BP 112/76 | HR 112 | Wt 187.2 lb

## 2017-07-16 DIAGNOSIS — O99210 Obesity complicating pregnancy, unspecified trimester: Secondary | ICD-10-CM

## 2017-07-16 DIAGNOSIS — Z3480 Encounter for supervision of other normal pregnancy, unspecified trimester: Secondary | ICD-10-CM

## 2017-07-16 NOTE — Progress Notes (Signed)
   PRENATAL VISIT NOTE  Subjective:  Samantha Knight is a 22 y.o. G1P0000 at [redacted]w[redacted]d being seen today for ongoing prenatal care.  She is currently monitored for the following issues for this low-risk pregnancy and has Supervision of other normal pregnancy, antepartum; BMI 30.0-30.9,adult; and Obesity affecting pregnancy, antepartum on their problem list.  Patient reports no complaints except for heartburn.  Contractions: Irregular. Vag. Bleeding: None.  Movement: Present. Denies leaking of fluid.   The following portions of the patient's history were reviewed and updated as appropriate: allergies, current medications, past family history, past medical history, past social history, past surgical history and problem list. Problem list updated.  Objective:   Vitals:   07/16/17 1137  BP: 112/76  Pulse: (!) 112  Weight: 187 lb 3.2 oz (84.9 kg)    Fetal Status: Fetal Heart Rate (bpm): 141   Movement: Present     General:  Alert, oriented and cooperative. Patient is in no acute distress.  Skin: Skin is warm and dry. No rash noted.   Cardiovascular: Normal heart rate noted  Respiratory: Normal respiratory effort, no problems with respiration noted  Abdomen: Soft, gravid, appropriate for gestational age.  Pain/Pressure: Absent     Pelvic: Cervical exam deferred        Extremities: Normal range of motion.  Edema: Mild pitting, slight indentation  Mental Status: Normal mood and affect. Normal behavior. Normal judgment and thought content.   Assessment and Plan:  Pregnancy: G1P0000 at [redacted]w[redacted]d  1. Supervision of other normal pregnancy, antepartum 2. Rec zantac OTC prn heartburn  2. Obesity affecting pregnancy, antepartum   Preterm labor symptoms and general obstetric precautions including but not limited to vaginal bleeding, contractions, leaking of fluid and fetal movement were reviewed in detail with the patient. Please refer to After Visit Summary for other counseling recommendations.    No follow-ups on file.  No future appointments.  Allie BossierMyra C Rona Tomson, MD

## 2017-07-28 ENCOUNTER — Ambulatory Visit (INDEPENDENT_AMBULATORY_CARE_PROVIDER_SITE_OTHER): Payer: Managed Care, Other (non HMO) | Admitting: Obstetrics & Gynecology

## 2017-07-28 VITALS — BP 118/81 | HR 72 | Wt 187.0 lb

## 2017-07-28 DIAGNOSIS — Z3480 Encounter for supervision of other normal pregnancy, unspecified trimester: Secondary | ICD-10-CM

## 2017-07-28 DIAGNOSIS — Z3403 Encounter for supervision of normal first pregnancy, third trimester: Secondary | ICD-10-CM

## 2017-07-28 NOTE — Patient Instructions (Addendum)
Contraception Choices Contraception, also called birth control, refers to methods or devices that prevent pregnancy. Hormonal methods Contraceptive implant A contraceptive implant is a thin, plastic tube that contains a hormone. It is inserted into the upper part of the arm. It can remain in place for up to 3 years. Progestin-only injections Progestin-only injections are injections of progestin, a synthetic form of the hormone progesterone. They are given every 3 months by a health care provider. Birth control pills Birth control pills are pills that contain hormones that prevent pregnancy. They must be taken once a day, preferably at the same time each day. Birth control patch The birth control patch contains hormones that prevent pregnancy. It is placed on the skin and must be changed once a week for three weeks and removed on the fourth week. A prescription is needed to use this method of contraception. Vaginal ring A vaginal ring contains hormones that prevent pregnancy. It is placed in the vagina for three weeks and removed on the fourth week. After that, the process is repeated with a new ring. A prescription is needed to use this method of contraception. Emergency contraceptive Emergency contraceptives prevent pregnancy after unprotected sex. They come in pill form and can be taken up to 5 days after sex. They work best the sooner they are taken after having sex. Most emergency contraceptives are available without a prescription. This method should not be used as your only form of birth control. Barrier methods Female condom A female condom is a thin sheath that is worn over the penis during sex. Condoms keep sperm from going inside a woman's body. They can be used with a spermicide to increase their effectiveness. They should be disposed after a single use. Female condom A female condom is a soft, loose-fitting sheath that is put into the vagina before sex. The condom keeps sperm from going  inside a woman's body. They should be disposed after a single use. Diaphragm A diaphragm is a soft, dome-shaped barrier. It is inserted into the vagina before sex, along with a spermicide. The diaphragm blocks sperm from entering the uterus, and the spermicide kills sperm. A diaphragm should be left in the vagina for 6-8 hours after sex and removed within 24 hours. A diaphragm is prescribed and fitted by a health care provider. A diaphragm should be replaced every 1-2 years, after giving birth, after gaining more than 15 lb (6.8 kg), and after pelvic surgery. Cervical cap A cervical cap is a round, soft latex or plastic cup that fits over the cervix. It is inserted into the vagina before sex, along with spermicide. It blocks sperm from entering the uterus. The cap should be left in place for 6-8 hours after sex and removed within 48 hours. A cervical cap must be prescribed and fitted by a health care provider. It should be replaced every 2 years. Sponge A sponge is a soft, circular piece of polyurethane foam with spermicide on it. The sponge helps block sperm from entering the uterus, and the spermicide kills sperm. To use it, you make it wet and then insert it into the vagina. It should be inserted before sex, left in for at least 6 hours after sex, and removed and thrown away within 30 hours. Spermicides Spermicides are chemicals that kill or block sperm from entering the cervix and uterus. They can come as a cream, jelly, suppository, foam, or tablet. A spermicide should be inserted into the vagina with an applicator at least 10-15 minutes before   sex to allow time for it to work. The process must be repeated every time you have sex. Spermicides do not require a prescription. Intrauterine contraception Intrauterine device (IUD) An IUD is a T-shaped device that is put in a woman's uterus. There are two types:  Hormone IUD.This type contains progestin, a synthetic form of the hormone progesterone. This  type can stay in place for 3-5 years.  Copper IUD.This type is wrapped in copper wire. It can stay in place for 10 years.  Permanent methods of contraception Female tubal ligation In this method, a woman's fallopian tubes are sealed, tied, or blocked during surgery to prevent eggs from traveling to the uterus. Hysteroscopic sterilization In this method, a small, flexible insert is placed into each fallopian tube. The inserts cause scar tissue to form in the fallopian tubes and block them, so sperm cannot reach an egg. The procedure takes about 3 months to be effective. Another form of birth control must be used during those 3 months. Female sterilization This is a procedure to tie off the tubes that carry sperm (vasectomy). After the procedure, the man can still ejaculate fluid (semen). Natural planning methods Natural family planning In this method, a couple does not have sex on days when the woman could become pregnant. Calendar method This means keeping track of the length of each menstrual cycle, identifying the days when pregnancy can happen, and not having sex on those days. Ovulation method In this method, a couple avoids sex during ovulation. Symptothermal method This method involves not having sex during ovulation. The woman typically checks for ovulation by watching changes in her temperature and in the consistency of cervical mucus. Post-ovulation method In this method, a couple waits to have sex until after ovulation. Summary  Contraception, also called birth control, means methods or devices that prevent pregnancy.  Hormonal methods of contraception include implants, injections, pills, patches, vaginal rings, and emergency contraceptives.  Barrier methods of contraception can include female condoms, female condoms, diaphragms, cervical caps, sponges, and spermicides.  There are two types of IUDs (intrauterine devices). An IUD can be put in a woman's uterus to prevent pregnancy  for 3-5 years.  Permanent sterilization can be done through a procedure for males, females, or both.  Natural family planning methods involve not having sex on days when the woman could become pregnant. This information is not intended to replace advice given to you by your health care provider. Make sure you discuss any questions you have with your health care provider. Document Released: 12/31/2004 Document Revised: 02/03/2016 Document Reviewed: 02/03/2016 Elsevier Interactive Patient Education  2018 [employer].   Research childbirth classes and hospital preregistration at [email or web address]  Fetal Movement Counts Patient Name: ________________________________________________ Patient Due Date: ____________________ What is a fetal movement count? A fetal movement count is the number of times that you feel your baby move during a certain amount of time. This may also be called a fetal kick count. A fetal movement count is recommended for every pregnant woman. You may be asked to start counting fetal movements as early as week 28 of your pregnancy. Pay attention to when your baby is most active. You may notice your baby's sleep and wake cycles. You may also notice things that make your baby move more. You should do a fetal movement count:  When your baby is normally most active.  At the same time each day.  A good time to count movements is while you are resting, after having something to  eat and drink. How do I count fetal movements? 1. Find a quiet, comfortable area. Sit, or lie down on your side. 2. Write down the date, the start time and stop time, and the number of movements that you felt between those two times. Take this information with you to your health care visits. 3. For 2 hours, count kicks, flutters, swishes, rolls, and jabs. You should feel at least 10 movements during 2 hours. 4. You may stop counting after you have felt 10 movements. 5. If you do not feel 10 movements  in 2 hours, have something to eat and drink. Then, keep resting and counting for 1 hour. If you feel at least 4 movements during that hour, you may stop counting. Contact a health care provider if:  You feel fewer than 4 movements in 2 hours.  Your baby is not moving like he or she usually does. Date: ____________ Start time: ____________ Stop time: ____________ Movements: ____________ Date: ____________ Start time: ____________ Stop time: ____________ Movements: ____________ Date: ____________ Start time: ____________ Stop time: ____________ Movements: ____________ Date: ____________ Start time: ____________ Stop time: ____________ Movements: ____________ Date: ____________ Start time: ____________ Stop time: ____________ Movements: ____________ Date: ____________ Start time: ____________ Stop time: ____________ Movements: ____________ Date: ____________ Start time: ____________ Stop time: ____________ Movements: ____________ Date: ____________ Start time: ____________ Stop time: ____________ Movements: ____________ Date: ____________ Start time: ____________ Stop time: ____________ Movements: ____________ This information is not intended to replace advice given to you by your health care provider. Make sure you discuss any questions you have with your health care provider. Document Released: 01/30/2006 Document Revised: 08/30/2015 Document Reviewed: 02/09/2015 Elsevier Interactive Patient Education  2018 [employer]  [employer] CorporationBraxton Hicks Contractions Contractions of the uterus can occur throughout pregnancy, but they are not always a sign that you are in labor. You may have practice contractions called Braxton Hicks contractions. These false labor contractions are sometimes confused with true labor. What are Deberah PeltonBraxton Hicks contractions? Braxton Hicks contractions are tightening movements that occur in the muscles of the uterus before labor. Unlike true labor contractions, these contractions do not  result in opening (dilation) and thinning of the cervix. Toward the end of pregnancy (32-34 weeks), Braxton Hicks contractions can happen more often and may become stronger. These contractions are sometimes difficult to tell apart from true labor because they can be very uncomfortable. You should not feel embarrassed if you go to the hospital with false labor. Sometimes, the only way to tell if you are in true labor is for your health care provider to look for changes in the cervix. The health care provider will do a physical exam and may monitor your contractions. If you are not in true labor, the exam should show that your cervix is not dilating and your water has not broken. If there are other health problems associated with your pregnancy, it is completely safe for you to be sent home with false labor. You may continue to have Braxton Hicks contractions until you go into true labor. How to tell the difference between true labor and false labor True labor  Contractions last 30-70 seconds.  Contractions become very regular.  Discomfort is usually felt in the top of the uterus, and it spreads to the lower abdomen and low back.  Contractions do not go away with walking.  Contractions usually become more intense and increase in frequency.  The cervix dilates and gets thinner. False labor  Contractions are usually shorter and not as  strong as true labor contractions.  Contractions are usually irregular.  Contractions are often felt in the front of the lower abdomen and in the groin.  Contractions may go away when you walk around or change positions while lying down.  Contractions get weaker and are shorter-lasting as time goes on.  The cervix usually does not dilate or become thin. Follow these instructions at home:  Take over-the-counter and prescription medicines only as told by your health care provider.  Keep up with your usual exercises and follow other instructions from your  health care provider.  Eat and drink lightly if you think you are going into labor.  If Braxton Hicks contractions are making you uncomfortable: ? Change your position from lying down or resting to walking, or change from walking to resting. ? Sit and rest in a tub of warm water. ? Drink enough fluid to keep your urine pale yellow. Dehydration may cause these contractions. ? Do slow and deep breathing several times an hour.  Keep all follow-up prenatal visits as told by your health care provider. This is important. Contact a health care provider if:  You have a fever.  You have continuous pain in your abdomen. Get help right away if:  Your contractions become stronger, more regular, and closer together.  You have fluid leaking or gushing from your vagina.  You pass blood-tinged mucus (bloody show).  You have bleeding from your vagina.  You have low back pain that you never had before.  You feel your baby's head pushing down and causing pelvic pressure.  Your baby is not moving inside you as much as it used to. Summary  Contractions that occur before labor are called Braxton Hicks contractions, false labor, or practice contractions.  Braxton Hicks contractions are usually shorter, weaker, farther apart, and less regular than true labor contractions. True labor contractions usually become progressively stronger and regular and they become more frequent.  Manage discomfort from [facility] contractions by changing position, resting in a warm bath, drinking plenty of water, or practicing deep breathing. This information is not intended to replace advice given to you by your health care provider. Make sure you discuss any questions you have with your health care provider. Document Released: 05/16/2016 Document Revised: 05/16/2016 Document Reviewed: 05/16/2016 Elsevier Interactive Patient Education  2018 [employer].

## 2017-07-28 NOTE — Progress Notes (Signed)
   PRENATAL VISIT NOTE  Subjective:  Samantha Knight is a 22 y.o. G1P0000 at 7139w2d being seen today for ongoing prenatal care.  She is currently monitored for the following issues for this low-risk pregnancy and has Supervision of other normal pregnancy, antepartum; BMI 30.0-30.9,adult; and Obesity affecting pregnancy, antepartum on their problem list.  Patient reports no complaints.  Contractions: Irregular.  .  Movement: Present. Denies leaking of fluid.   The following portions of the patient's history were reviewed and updated as appropriate: allergies, current medications, past family history, past medical history, past social history, past surgical history and problem list. Problem list updated.  Objective:   Vitals:   07/28/17 1523  BP: 118/81  Pulse: 72  Weight: 187 lb (84.8 kg)    Fetal Status: Fetal Heart Rate (bpm): 137 Fundal Height: 34 cm Movement: Present     General:  Alert, oriented and cooperative. Patient is in no acute distress.  Skin: Skin is warm and dry. No rash noted.   Cardiovascular: Normal heart rate noted  Respiratory: Normal respiratory effort, no problems with respiration noted  Abdomen: Soft, gravid, appropriate for gestational age.  Pain/Pressure: Absent     Pelvic: Cervical exam deferred        Extremities: Normal range of motion.     Mental Status: Normal mood and affect. Normal behavior. Normal judgment and thought content.   Assessment and Plan:  Pregnancy: G1P0000 at 5739w2d  1. Supervision of other normal pregnancy, antepartum Term labor symptoms and general obstetric precautions including but not limited to vaginal bleeding, contractions, leaking of fluid and fetal movement were reviewed in detail with the patient. Please refer to After Visit Summary for other counseling recommendations.  Return in about 2 weeks (around 08/11/2017) for Pelvic cultures, OB Visit.   Jaynie CollinsUgonna Anyanwu, MD

## 2017-08-06 ENCOUNTER — Telehealth: Payer: Self-pay | Admitting: Obstetrics & Gynecology

## 2017-08-06 NOTE — Telephone Encounter (Signed)
Pt called 35 weeks. Pt states pain since yesterday mostly bottom stomach area and back ache. Pain not on regular schedule. Please advise if she should go to the hospital. Call pt [phone].

## 2017-08-06 NOTE — Telephone Encounter (Signed)
Call patient to inform to address her concerns. No answer or voice mail to leave a message.

## 2017-08-11 ENCOUNTER — Ambulatory Visit (INDEPENDENT_AMBULATORY_CARE_PROVIDER_SITE_OTHER): Payer: Managed Care, Other (non HMO) | Admitting: Obstetrics & Gynecology

## 2017-08-11 VITALS — BP 107/72 | HR 121 | Wt 188.8 lb

## 2017-08-11 DIAGNOSIS — Z3480 Encounter for supervision of other normal pregnancy, unspecified trimester: Secondary | ICD-10-CM

## 2017-08-11 DIAGNOSIS — Z113 Encounter for screening for infections with a predominantly sexual mode of transmission: Secondary | ICD-10-CM | POA: Diagnosis not present

## 2017-08-11 NOTE — Patient Instructions (Signed)
Return to clinic for any scheduled appointments or obstetric concerns, or go to [facility] for evaluation  

## 2017-08-11 NOTE — Progress Notes (Signed)
   PRENATAL VISIT NOTE  Subjective:  Samantha Knight is a 22 y.o. G1P0000 at 4331w2d being seen today for ongoing prenatal care.  She is currently monitored for the following issues for this low-risk pregnancy and has Supervision of other normal pregnancy, antepartum; BMI 30.0-30.9,adult; and Obesity affecting pregnancy, antepartum on their problem list.  Patient reports no complaints.  Contractions: Irregular. Vag. Bleeding: None.  Movement: Present. Denies leaking of fluid.   The following portions of the patient's history were reviewed and updated as appropriate: allergies, current medications, past family history, past medical history, past social history, past surgical history and problem list. Problem list updated.  Objective:   Vitals:   08/11/17 1548  BP: 107/72  Pulse: (!) 121  Weight: 188 lb 12.8 oz (85.6 kg)    Fetal Status: Fetal Heart Rate (bpm): 151 Fundal Height: 36 cm Movement: Present  Presentation: Vertex  General:  Alert, oriented and cooperative. Patient is in no acute distress.  Skin: Skin is warm and dry. No rash noted.   Cardiovascular: Normal heart rate noted  Respiratory: Normal respiratory effort, no problems with respiration noted  Abdomen: Soft, gravid, appropriate for gestational age.  Pain/Pressure: Present     Pelvic: Cervical exam deferred Dilation: Closed Effacement (%): Thick Station: Ballotable  Extremities: Normal range of motion.  Edema: Trace  Mental Status: Normal mood and affect. Normal behavior. Normal judgment and thought content.   Assessment and Plan:  Pregnancy: G1P0000 at 7831w2d  1. Supervision of other normal pregnancy, antepartum Pelvic cultures done today. - Culture, beta strep (group b only) - GC/Chlamydia probe amp ([place] Preterm labor symptoms and general obstetric precautions including but not limited to vaginal bleeding, contractions, leaking of fluid and fetal movement were reviewed in detail with the  patient. Please refer to After Visit Summary for other counseling recommendations.  Return in about 1 week (around 08/18/2017) for OB Visit.  No future appointments.  Jaynie CollinsUgonna Anyanwu, MD

## 2017-08-13 LAB — GC/CHLAMYDIA PROBE AMP (~~LOC~~) NOT AT ARMC
Chlamydia: NEGATIVE
Neisseria Gonorrhea: NEGATIVE

## 2017-08-14 LAB — CULTURE, BETA STREP (GROUP B ONLY): Strep Gp B Culture: NEGATIVE

## 2017-08-19 ENCOUNTER — Ambulatory Visit (INDEPENDENT_AMBULATORY_CARE_PROVIDER_SITE_OTHER): Payer: Managed Care, Other (non HMO) | Admitting: Family Medicine

## 2017-08-19 VITALS — BP 113/73 | HR 72 | Wt 190.8 lb

## 2017-08-19 DIAGNOSIS — Z3480 Encounter for supervision of other normal pregnancy, unspecified trimester: Secondary | ICD-10-CM

## 2017-08-19 DIAGNOSIS — Z3403 Encounter for supervision of normal first pregnancy, third trimester: Secondary | ICD-10-CM

## 2017-08-19 NOTE — Patient Instructions (Signed)

## 2017-08-19 NOTE — Progress Notes (Signed)
   PRENATAL VISIT NOTE  Subjective:  Samantha Knight is a 22 y.o. G1P0000 at 679w3d being seen today for ongoing prenatal care.  She is currently monitored for the following issues for this low-risk pregnancy and has Supervision of other normal pregnancy, antepartum; BMI 30.0-30.9,adult; and Obesity affecting pregnancy, antepartum on their problem list.  Patient reports no complaints.  Contractions: Not present.  .  Movement: Present. Denies leaking of fluid.   The following portions of the patient's history were reviewed and updated as appropriate: allergies, current medications, past family history, past medical history, past social history, past surgical history and problem list. Problem list updated.  Objective:   Vitals:   08/19/17 1418  BP: 113/73  Pulse: 72  Weight: 190 lb 12.8 oz (86.5 kg)    Fetal Status: Fetal Heart Rate (bpm): 147 Fundal Height: 33 cm Movement: Present  Presentation: Vertex  General:  Alert, oriented and cooperative. Patient is in no acute distress.  Skin: Skin is warm and dry. No rash noted.   Cardiovascular: Normal heart rate noted  Respiratory: Normal respiratory effort, no problems with respiration noted  Abdomen: Soft, gravid, appropriate for gestational age.  Pain/Pressure: Present     Pelvic: Cervical exam performed Dilation: 1 Effacement (%): 70 Station: -2  Extremities: Normal range of motion.     Mental Status: Normal mood and affect. Normal behavior. Normal judgment and thought content.   Assessment and Plan:  Pregnancy: G1P0000 at [redacted]w[redacted]d  1. Supervision of other normal pregnancy, antepartum Continue routine prenatal care.   Term labor symptoms and general obstetric precautions including but not limited to vaginal bleeding, contractions, leaking of fluid and fetal movement were reviewed in detail with the patient. Please refer to After Visit Summary for other counseling recommendations.  Return in 1 week (on 08/26/2017).  Future  Appointments  Date Time Provider Department Center  08/25/2017  4:00 PM Anyanwu, Jethro BastosUgonna A, MD [facility]  09/01/2017  2:45 PM Reva BoresPratt, Rad Gramling S, MD [facility]    Reva Boresanya S Mahki Spikes, MD

## 2017-08-25 ENCOUNTER — Encounter: Payer: Self-pay | Admitting: Obstetrics & Gynecology

## 2017-08-25 ENCOUNTER — Ambulatory Visit (INDEPENDENT_AMBULATORY_CARE_PROVIDER_SITE_OTHER): Payer: Managed Care, Other (non HMO) | Admitting: Obstetrics & Gynecology

## 2017-08-25 VITALS — BP 110/75 | HR 123 | Wt 192.6 lb

## 2017-08-25 DIAGNOSIS — Z113 Encounter for screening for infections with a predominantly sexual mode of transmission: Secondary | ICD-10-CM | POA: Diagnosis not present

## 2017-08-25 DIAGNOSIS — Z3480 Encounter for supervision of other normal pregnancy, unspecified trimester: Secondary | ICD-10-CM

## 2017-08-25 DIAGNOSIS — O26893 Other specified pregnancy related conditions, third trimester: Secondary | ICD-10-CM | POA: Diagnosis not present

## 2017-08-25 DIAGNOSIS — N898 Other specified noninflammatory disorders of vagina: Secondary | ICD-10-CM

## 2017-08-25 NOTE — Progress Notes (Signed)
   PRENATAL VISIT NOTE  Subjective:  Samantha Knight is a 22 y.o. G1P0000 at 1171w2d being seen today for ongoing prenatal care.  She is currently monitored for the following issues for this low-risk pregnancy and has Supervision of other normal pregnancy, antepartum; BMI 30.0-30.9,adult; and Obesity affecting pregnancy, antepartum on their problem list.  Patient reports brown discharge that is malodorous.  Contractions: Irregular. Vag. Bleeding: None.  Movement: Present. Denies leaking of fluid.   The following portions of the patient's history were reviewed and updated as appropriate: allergies, current medications, past family history, past medical history, past social history, past surgical history and problem list. Problem list updated.  Objective:   Vitals:   08/25/17 1608  BP: 110/75  Pulse: (!) 123  Weight: 192 lb 9.6 oz (87.4 kg)    Fetal Status: Fetal Heart Rate (bpm): 138 Fundal Height: 38 cm Movement: Present  Presentation: Vertex  General:  Alert, oriented and cooperative. Patient is in no acute distress.  Skin: Skin is warm and dry. No rash noted.   Cardiovascular: Normal heart rate noted  Respiratory: Normal respiratory effort, no problems with respiration noted  Abdomen: Soft, gravid, appropriate for gestational age.  Pain/Pressure: Present     Pelvic: Cervical exam performed Dilation: 1 Effacement (%): 70 Station: -2  Extremities: Normal range of motion.  Edema: Trace  Mental Status: Normal mood and affect. Normal behavior. Normal judgment and thought content.   Assessment and Plan:  Pregnancy: G1P0000 at 6471w2d  1. Vaginal discharge during pregnancy in third trimester - Cervicovaginal ancillary only done, will follow up results and manage accordingly.  2. Supervision of other normal pregnancy, antepartum Term labor symptoms and general obstetric precautions including but not limited to vaginal bleeding, contractions, leaking of fluid and fetal movement were  reviewed in detail with the patient. Please refer to After Visit Summary for other counseling recommendations.  Return in about 1 week (around 09/01/2017) for OB Visit.  Future Appointments  Date Time Provider Department Center  09/01/2017  2:45 PM Reva BoresPratt, Tanya S, MD [facility]    Jaynie CollinsUgonna Markel Mergenthaler, MD

## 2017-08-25 NOTE — Patient Instructions (Addendum)
Return to clinic for any scheduled appointments or obstetric concerns, or go to [facility]U for evaluation  Places to have your son circumcised:  Prices are for Medicaid or uninsured patients as private insurance covers this procedure most of the time.  [facility] [phone] $480 while you are in hospital  [facility] [phone] $244 by 4 wks  [facility] [phone] $175 by 2 wks  [facility] [phone] $250 by 7 days [facility] [phone] $269 by 4 wks  These prices sometimes change but are roughly what you can expect to pay. Please call and confirm pricing.   Circumcision is considered an elective/non-medically necessary procedure. There are many reasons parents decide to have their sons circumsized. During the first year of life circumcised males have a reduced risk of urinary tract infections but after this year the rates between circumcised males and uncircumcised males are the same.  It is safe to have your son circumcised outside of the hospital and the places above perform them regularly.   Deciding about Circumcision in Baby Boys  (Up-to-date The Basics)  What is circumcision?  Circumcision is a surgery that removes the skin that covers the tip of the penis, called the "foreskin" Circumcision is usually done when a boy is between 311 and 8710 days old. In the [place], circumcision is common. In some other countries, fewer boys are circumcised. Circumcision is a common tradition in some religions.  Should I have my baby boy circumcised?  There is no easy answer. Circumcision has some benefits. But it also has risks. After talking with your doctor, you will have to decide for yourself what is right for your family.  What are the benefits of circumcision?  Circumcised boys seem to have slightly lower rates of: ?Urinary  tract infections ?Swelling of the opening at the tip of the penis Circumcised men seem to have slightly lower rates of: ?Urinary tract infections ?Swelling of the opening at the tip of the penis ?Penis cancer ?HIV and other infections that you catch during sex ?Cervical cancer in the women they have sex with Even so, in the [place], the risks of these problems are small - even in boys and men who have not been circumcised. Plus, boys and men who are not circumcised can reduce these extra risks by: ?Cleaning their penis well ?Using condoms during sex  What are the risks of circumcision?  Risks include: ?Bleeding or infection from the surgery ?Damage to or amputation of the penis ?A chance that the doctor will cut off too much or not enough of the foreskin ?A chance that sex won't feel as good later in life Only about 1 out of every 200 circumcisions leads to problems. There is also a chance that your health insurance won't pay for circumcision.  How is circumcision done in baby boys?  First, the baby gets medicine for pain relief. This might be a cream on the skin or a shot into the base of the penis. Next, the doctor cleans the baby's penis well. Then he or she uses special tools to cut off the foreskin. Finally, the doctor wraps a bandage (called gauze) around the baby's penis. If you have your baby circumcised, his doctor or nurse will give you instructions on how to care for him after the surgery. It is important that you follow those instructions carefully.

## 2017-08-27 LAB — CERVICOVAGINAL ANCILLARY ONLY
BACTERIAL VAGINITIS: NEGATIVE
CANDIDA VAGINITIS: NEGATIVE
Trichomonas: NEGATIVE

## 2017-09-01 ENCOUNTER — Ambulatory Visit (INDEPENDENT_AMBULATORY_CARE_PROVIDER_SITE_OTHER): Payer: Managed Care, Other (non HMO) | Admitting: Family Medicine

## 2017-09-01 ENCOUNTER — Encounter (HOSPITAL_COMMUNITY): Payer: Self-pay | Admitting: *Deleted

## 2017-09-01 ENCOUNTER — Inpatient Hospital Stay (HOSPITAL_COMMUNITY)
Admission: AD | Admit: 2017-09-01 | Discharge: 2017-09-03 | DRG: 807 | Disposition: A | Discharge status: Home / Self Care | Payer: Managed Care, Other (non HMO) | Attending: Obstetrics and Gynecology | Admitting: Obstetrics and Gynecology

## 2017-09-01 VITALS — BP 113/70 | HR 109 | Wt 189.4 lb

## 2017-09-01 DIAGNOSIS — O99214 Obesity complicating childbirth: Secondary | ICD-10-CM | POA: Diagnosis present

## 2017-09-01 DIAGNOSIS — Z3483 Encounter for supervision of other normal pregnancy, third trimester: Secondary | ICD-10-CM | POA: Diagnosis present

## 2017-09-01 DIAGNOSIS — Z3480 Encounter for supervision of other normal pregnancy, unspecified trimester: Secondary | ICD-10-CM

## 2017-09-01 DIAGNOSIS — E669 Obesity, unspecified: Secondary | ICD-10-CM | POA: Diagnosis present

## 2017-09-01 DIAGNOSIS — Z3A39 39 weeks gestation of pregnancy: Secondary | ICD-10-CM

## 2017-09-01 DIAGNOSIS — Z87891 Personal history of nicotine dependence: Secondary | ICD-10-CM | POA: Diagnosis not present

## 2017-09-01 DIAGNOSIS — O99210 Obesity complicating pregnancy, unspecified trimester: Secondary | ICD-10-CM

## 2017-09-01 LAB — CBC
HEMATOCRIT: 28.8 % — AB (ref 36.0–46.0)
Hemoglobin: 9.1 g/dL — ABNORMAL LOW (ref 12.0–15.0)
MCH: 25.9 pg — ABNORMAL LOW (ref 26.0–34.0)
MCHC: 31.6 g/dL (ref 30.0–36.0)
MCV: 82.1 fL (ref 78.0–100.0)
PLATELETS: 214 10*3/uL (ref 150–400)
RBC: 3.51 MIL/uL — ABNORMAL LOW (ref 3.87–5.11)
RDW: 14.7 % (ref 11.5–15.5)
WBC: 17.8 10*3/uL — AB (ref 4.0–10.5)

## 2017-09-01 MED ORDER — ZOLPIDEM TARTRATE 5 MG PO TABS: 5.0000 mg | ORAL_TABLET | Freq: Every evening | ORAL | Status: DC | PRN

## 2017-09-01 MED ORDER — DIBUCAINE 1 % RE OINT: 1.0000 "application " | TOPICAL_OINTMENT | RECTAL | Status: DC | PRN

## 2017-09-01 MED ORDER — ONDANSETRON HCL 4 MG/2ML IJ SOLN: 4.0000 mg | INTRAMUSCULAR | Status: DC | PRN

## 2017-09-01 MED ORDER — BENZOCAINE-MENTHOL 20-0.5 % EX AERO
1.0000 "application " | INHALATION_SPRAY | CUTANEOUS | Status: DC | PRN
  Administered 2017-09-02: 1 via TOPICAL
  Filled 2017-09-01: qty 56

## 2017-09-01 MED ORDER — IBUPROFEN 600 MG PO TABS
600.0000 mg | ORAL_TABLET | Freq: Four times a day (QID) | ORAL | Status: DC
  Administered 2017-09-01 – 2017-09-03 (×6): 600 mg via ORAL
  Filled 2017-09-01 (×6): qty 1

## 2017-09-01 MED ORDER — LIDOCAINE HCL (PF) 1 % IJ SOLN
INTRAMUSCULAR | Status: AC
  Administered 2017-09-01: 30 mL
  Filled 2017-09-01: qty 30

## 2017-09-01 MED ORDER — DIPHENHYDRAMINE HCL 25 MG PO CAPS: 25.0000 mg | ORAL_CAPSULE | Freq: Four times a day (QID) | ORAL | Status: DC | PRN

## 2017-09-01 MED ORDER — OXYTOCIN 40 UNITS IN LACTATED RINGERS INFUSION - SIMPLE MED
INTRAVENOUS | Status: AC
  Administered 2017-09-01: 19:00:00
  Filled 2017-09-01: qty 1000

## 2017-09-01 MED ORDER — TETANUS-DIPHTH-ACELL PERTUSSIS 5-2.5-18.5 LF-MCG/0.5 IM SUSP: 0.5000 mL | Freq: Once | INTRAMUSCULAR | Status: DC

## 2017-09-01 MED ORDER — COCONUT OIL OIL: 1.0000 "application " | TOPICAL_OIL | Status: DC | PRN

## 2017-09-01 MED ORDER — SIMETHICONE 80 MG PO CHEW: 80.0000 mg | CHEWABLE_TABLET | ORAL | Status: DC | PRN

## 2017-09-01 MED ORDER — PRENATAL MULTIVITAMIN CH
1.0000 | ORAL_TABLET | Freq: Every day | ORAL | Status: DC
  Administered 2017-09-02: 1 via ORAL
  Filled 2017-09-01: qty 1

## 2017-09-01 MED ORDER — SENNOSIDES-DOCUSATE SODIUM 8.6-50 MG PO TABS
2.0000 | ORAL_TABLET | ORAL | Status: DC
  Administered 2017-09-01 – 2017-09-02 (×2): 2 via ORAL
  Filled 2017-09-01 (×2): qty 2

## 2017-09-01 MED ORDER — ONDANSETRON HCL 4 MG PO TABS: 4.0000 mg | ORAL_TABLET | ORAL | Status: DC | PRN

## 2017-09-01 MED ORDER — WITCH HAZEL-GLYCERIN EX PADS: 1.0000 "application " | MEDICATED_PAD | CUTANEOUS | Status: DC | PRN

## 2017-09-01 MED ORDER — OXYTOCIN 10 UNIT/ML IJ SOLN
INTRAMUSCULAR | Status: AC
  Filled 2017-09-01: qty 1

## 2017-09-01 MED ORDER — ACETAMINOPHEN 325 MG PO TABS
650.0000 mg | ORAL_TABLET | ORAL | Status: DC | PRN
  Administered 2017-09-02: 650 mg via ORAL
  Filled 2017-09-01: qty 2

## 2017-09-01 NOTE — H&P (Signed)
LABOR AND DELIVERY ADMISSION HISTORY AND PHYSICAL NOTE  Samantha Knight is a 22 y.o. female G1P0000 with IUP at 6762w2d by 5 wk sono presenting for SOL with precipitous delivery.  She reports positive fetal movement. She denies leakage of fluid or vaginal bleeding.  Prenatal History/Complications: [facility]  Pregnancy complications:  - obesity   Past Medical History: Past Medical History:  Diagnosis Date  . Asthma    as a child.   . Migraine     Past Surgical History: Past Surgical History:  Procedure Laterality Date  . NO PAST SURGERIES      Obstetrical History: OB History    Gravida  1   Para  0   Term  0   Preterm      AB      Living  0     SAB      TAB      Ectopic      Multiple      Live Births              Social History: Social History   Socioeconomic History  . Marital status: Single    Spouse name: Not on file  . Number of children: Not on file  . Years of education: Not on file  . Highest education level: Not on file  Occupational History  . Not on file  Social Needs  . Financial resource strain: Not on file  . Food insecurity:    Worry: Not on file    Inability: Not on file  . Transportation needs:    Medical: Not on file    Non-medical: Not on file  Tobacco Use  . Smoking status: Former Smoker    Types: Cigars  . Smokeless tobacco: Never Used  . Tobacco comment: quit with +UPT  Substance and Sexual Activity  . Alcohol use: No  . Drug use: No  . Sexual activity: Yes    Birth control/protection: None  Lifestyle  . Physical activity:    Days per week: Not on file    Minutes per session: Not on file  . Stress: Not on file  Relationships  . Social connections:    Talks on phone: Not on file    Gets together: Not on file    Attends religious service: Not on file    Active member of club or organization: Not on file    Attends meetings of clubs or organizations: Not on file    Relationship  status: Not on file  Other Topics Concern  . Not on file  Social History Narrative  . Not on file    Family History: Family History  Problem Relation Age of Onset  . Cancer Neg Hx   . Diabetes Neg Hx   . Heart disease Neg Hx   . Stroke Neg Hx     Allergies: No Known Allergies  Medications Prior to Admission  Medication Sig Dispense Refill Last Dose  . acetaminophen (TYLENOL) 500 MG tablet Take 1,000 mg by mouth every 6 (six) hours as needed for moderate pain.   Taking  . diphenhydrAMINE (BENADRYL) 25 MG tablet Take 25 mg by mouth every 6 (six) hours as needed for allergies.   Taking     Review of Systems  All systems reviewed and negative except as stated in HPI  Physical Exam Last menstrual period 11/08/2016. General appearance: alert, oriented, NAD Lungs: normal respiratory effort Heart: regular rate Abdomen: soft, non-tender;  gravid, FH appropriate for GA Extremities: No calf swelling or tenderness Presentation: cephalic Dilation: 10 Dilation Complete Date: 09/01/17 Dilation Complete Time: 1712 Effacement (%): 100 Station: Plus 3 Presentation: Vertex Exam by:: Sharon SellerHeather Koran RNC-OB   Prenatal labs: ABO, Rh: A/Positive/-- (01/11 1104) Antibody: Negative (01/11 1104) Rubella: 3.15 (01/11 1104) RPR: Non Reactive (06/04 0820)  HBsAg: Negative (01/11 1104)  HIV: Non Reactive (06/04 0820)  GC/Chlamydia: Neg GBS:   Neg   GTT: Normal  Genetic screening:  Declined  Anatomy US: Normal   Prenatal Transfer Tool  Maternal Diabetes: No Genetic Screening: Declined Maternal Ultrasounds/Referrals: Normal Fetal Ultrasounds or other Referrals:  None Maternal Substance Abuse:  No Significant Maternal Medications:  None Significant Maternal Lab Results: Lab values include: Group B Strep negative  No results found for this or any previous visit (from the past 24 hour(s)).  Patient Active Problem List   Diagnosis Date Noted  . Obesity affecting pregnancy, antepartum  03/19/2017  . Supervision of other normal pregnancy, antepartum 01/10/2017  . BMI 30.0-30.9,adult 01/10/2017    Assessment: Samantha Knight is a 22 y.o. G1P0000 at 5537w2d here for SOL.   #Labor: Delivery at time of admission  #Pain: Maternal support  #ID:  GBS neg  #MOF: Bottle  #MOC:Undecided  #Circ:  Inpatient   De HollingsheadCatherine L Sagan Wurzel 09/01/2017, 5:12 PM

## 2017-09-01 NOTE — MAU Note (Signed)
Pt presents to MAU with c/o ctx that started at 10 am and bloody show. Pt denies VB.

## 2017-09-01 NOTE — Patient Instructions (Addendum)
 Breastfeeding Choosing to breastfeed is one of the best decisions you can make for yourself and your baby. A change in hormones during pregnancy causes your breasts to make breast milk in your milk-producing glands. Hormones prevent breast milk from being released before your baby is born. They also prompt milk flow after birth. Once breastfeeding has begun, thoughts of your baby, as well as his or her sucking or crying, can stimulate the release of milk from your milk-producing glands. Benefits of breastfeeding Research shows that breastfeeding offers many health benefits for infants and mothers. It also offers a cost-free and convenient way to feed your baby. For your baby  Your first milk (colostrum) helps your baby's digestive system to function better.  Special cells in your milk (antibodies) help your baby to fight off infections.  Breastfed babies are less likely to develop asthma, allergies, obesity, or type 2 diabetes. They are also at lower risk for sudden infant death syndrome (SIDS).  Nutrients in breast milk are better able to meet your baby's needs compared to infant formula.  Breast milk improves your baby's brain development. For you  Breastfeeding helps to create a very special bond between you and your baby.  Breastfeeding is convenient. Breast milk costs nothing and is always available at the correct temperature.  Breastfeeding helps to burn calories. It helps you to lose the weight that you gained during pregnancy.  Breastfeeding makes your uterus return faster to its size before pregnancy. It also slows bleeding (lochia) after you give birth.  Breastfeeding helps to lower your risk of developing type 2 diabetes, osteoporosis, rheumatoid arthritis, cardiovascular disease, and breast, ovarian, uterine, and endometrial cancer later in life. Breastfeeding basics Starting breastfeeding  Find a comfortable place to sit or lie down, with your neck and back  well-supported.  Place a pillow or a rolled-up blanket under your baby to bring him or her to the level of your breast (if you are seated). Nursing pillows are specially designed to help support your arms and your baby while you breastfeed.  Make sure that your baby's tummy (abdomen) is facing your abdomen.  Gently massage your breast. With your fingertips, massage from the outer edges of your breast inward toward the nipple. This encourages milk flow. If your milk flows slowly, you may need to continue this action during the feeding.  Support your breast with 4 fingers underneath and your thumb above your nipple (make the letter "C" with your hand). Make sure your fingers are well away from your nipple and your baby's mouth.  Stroke your baby's lips gently with your finger or nipple.  When your baby's mouth is open wide enough, quickly bring your baby to your breast, placing your entire nipple and as much of the areola as possible into your baby's mouth. The areola is the colored area around your nipple. ? More areola should be visible above your baby's upper lip than below the lower lip. ? Your baby's lips should be opened and extended outward (flanged) to ensure an adequate, comfortable latch. ? Your baby's tongue should be between his or her lower gum and your breast.  Make sure that your baby's mouth is correctly positioned around your nipple (latched). Your baby's lips should create a seal on your breast and be turned out (everted).  It is common for your baby to suck about 2-3 minutes in order to start the flow of breast milk. Latching Teaching your baby how to latch onto your breast properly is   very important. An improper latch can cause nipple pain, decreased milk supply, and poor weight gain in your baby. Also, if your baby is not latched onto your nipple properly, he or she may swallow some air during feeding. This can make your baby fussy. Burping your baby when you switch breasts  during the feeding can help to get rid of the air. However, teaching your baby to latch on properly is still the best way to prevent fussiness from swallowing air while breastfeeding. Signs that your baby has successfully latched onto your nipple  Silent tugging or silent sucking, without causing you pain. Infant's lips should be extended outward (flanged).  Swallowing heard between every 3-4 sucks once your milk has started to flow (after your let-down milk reflex occurs).  Muscle movement above and in front of his or her ears while sucking.  Signs that your baby has not successfully latched onto your nipple  Sucking sounds or smacking sounds from your baby while breastfeeding.  Nipple pain.  If you think your baby has not latched on correctly, slip your finger into the corner of your baby's mouth to break the suction and place it between your baby's gums. Attempt to start breastfeeding again. Signs of successful breastfeeding Signs from your baby  Your baby will gradually decrease the number of sucks or will completely stop sucking.  Your baby will fall asleep.  Your baby's body will relax.  Your baby will retain a small amount of milk in his or her mouth.  Your baby will let go of your breast by himself or herself.  Signs from you  Breasts that have increased in firmness, weight, and size 1-3 hours after feeding.  Breasts that are softer immediately after breastfeeding.  Increased milk volume, as well as a change in milk consistency and color by the fifth day of breastfeeding.  Nipples that are not sore, cracked, or bleeding.  Signs that your baby is getting enough milk  Wetting at least 1-2 diapers during the first 24 hours after birth.  Wetting at least 5-6 diapers every 24 hours for the first week after birth. The urine should be clear or pale yellow by the age of 5 days.  Wetting 6-8 diapers every 24 hours as your baby continues to grow and develop.  At least 3  stools in a 24-hour period by the age of 5 days. The stool should be soft and yellow.  At least 3 stools in a 24-hour period by the age of 7 days. The stool should be seedy and yellow.  No loss of weight greater than 10% of birth weight during the first 3 days of life.  Average weight gain of 4-7 oz (113-198 g) per week after the age of 4 days.  Consistent daily weight gain by the age of 5 days, without weight loss after the age of 2 weeks. After a feeding, your baby may spit up a small amount of milk. This is normal. Breastfeeding frequency and duration Frequent feeding will help you make more milk and can prevent sore nipples and extremely full breasts (breast engorgement). Breastfeed when you feel the need to reduce the fullness of your breasts or when your baby shows signs of hunger. This is called "breastfeeding on demand." Signs that your baby is hungry include:  Increased alertness, activity, or restlessness.  Movement of the head from side to side.  Opening of the mouth when the corner of the mouth or cheek is stroked (rooting).  Increased sucking   sounds, smacking lips, cooing, sighing, or squeaking.  Hand-to-mouth movements and sucking on fingers or hands.  Fussing or crying.  Avoid introducing a pacifier to your baby in the first 4-6 weeks after your baby is born. After this time, you may choose to use a pacifier. Research has shown that pacifier use during the first year of a baby's life decreases the risk of sudden infant death syndrome (SIDS). Allow your baby to feed on each breast as long as he or she wants. When your baby unlatches or falls asleep while feeding from the first breast, offer the second breast. Because newborns are often sleepy in the first few weeks of life, you may need to awaken your baby to get him or her to feed. Breastfeeding times will vary from baby to baby. However, the following rules can serve as a guide to help you make sure that your baby is  properly fed:  Newborns (babies 4 weeks of age or younger) may breastfeed every 1-3 hours.  Newborns should not go without breastfeeding for longer than 3 hours during the day or 5 hours during the night.  You should breastfeed your baby a minimum of 8 times in a 24-hour period.  Breast milk pumping Pumping and storing breast milk allows you to make sure that your baby is exclusively fed your breast milk, even at times when you are unable to breastfeed. This is especially important if you go back to work while you are still breastfeeding, or if you are not able to be present during feedings. Your lactation consultant can help you find a method of pumping that works best for you and give you guidelines about how long it is safe to store breast milk. Caring for your breasts while you breastfeed Nipples can become dry, cracked, and sore while breastfeeding. The following recommendations can help keep your breasts moisturized and healthy:  Avoid using soap on your nipples.  Wear a supportive bra designed especially for nursing. Avoid wearing underwire-style bras or extremely tight bras (sports bras).  Air-dry your nipples for 3-4 minutes after each feeding.  Use only cotton bra pads to absorb leaked breast milk. Leaking of breast milk between feedings is normal.  Use lanolin on your nipples after breastfeeding. Lanolin helps to maintain your skin's normal moisture barrier. Pure lanolin is not harmful (not toxic) to your baby. You may also hand express a few drops of breast milk and gently massage that milk into your nipples and allow the milk to air-dry.  In the first few weeks after giving birth, some women experience breast engorgement. Engorgement can make your breasts feel heavy, warm, and tender to the touch. Engorgement peaks within 3-5 days after you give birth. The following recommendations can help to ease engorgement:  Completely empty your breasts while breastfeeding or pumping. You  may want to start by applying warm, moist heat (in the shower or with warm, water-soaked hand towels) just before feeding or pumping. This increases circulation and helps the milk flow. If your baby does not completely empty your breasts while breastfeeding, pump any extra milk after he or she is finished.  Apply ice packs to your breasts immediately after breastfeeding or pumping, unless this is too uncomfortable for you. To do this: ? Put ice in a plastic bag. ? Place a towel between your skin and the bag. ? Leave the ice on for 20 minutes, 2-3 times a day.  Make sure that your baby is latched on and positioned properly   while breastfeeding.  If engorgement persists after 48 hours of following these recommendations, contact your health care provider or a Advertising copywriterlactation consultant. Overall health care recommendations while breastfeeding  Eat 3 healthy meals and 3 snacks every day. Well-nourished mothers who are breastfeeding need an additional 450-500 calories a day. You can meet this requirement by increasing the amount of a balanced diet that you eat.  Drink enough water to keep your urine pale yellow or clear.  Rest often, relax, and continue to take your prenatal vitamins to prevent fatigue, stress, and low vitamin and mineral levels in your body (nutrient deficiencies).  Do not use any products that contain nicotine or tobacco, such as cigarettes and e-cigarettes. Your baby may be harmed by chemicals from cigarettes that pass into breast milk and exposure to secondhand smoke. If you need help quitting, ask your health care provider.  Avoid alcohol.  Do not use illegal drugs or marijuana.  Talk with your health care provider before taking any medicines. These include over-the-counter and prescription medicines as well as vitamins and herbal supplements. Some medicines that may be harmful to your baby can pass through breast milk.  It is possible to become pregnant while breastfeeding. If  birth control is desired, ask your health care provider about options that will be safe while breastfeeding your baby. Where to find more information: [employer]: www.llli.org Contact a health care provider if:  You feel like you want to stop breastfeeding or have become frustrated with breastfeeding.  Your nipples are cracked or bleeding.  Your breasts are red, tender, or warm.  You have: ? Painful breasts or nipples. ? A swollen area on either breast. ? A fever or chills. ? Nausea or vomiting. ? Drainage other than breast milk from your nipples.  Your breasts do not become full before feedings by the fifth day after you give birth.  You feel sad and depressed.  Your baby is: ? Too sleepy to eat well. ? Having trouble sleeping. ? More than 201 week old and wetting fewer than 6 diapers in a 24-hour period. ? Not gaining weight by 645 days of age.  Your baby has fewer than 3 stools in a 24-hour period.  Your baby's skin or the white parts of his or her eyes become yellow. Get help right away if:  Your baby is overly tired (lethargic) and does not want to wake up and feed.  Your baby develops an unexplained fever. Summary  Breastfeeding offers many health benefits for infant and mothers.  Try to breastfeed your infant when he or she shows early signs of hunger.  Gently tickle or stroke your baby's lips with your finger or nipple to allow the baby to open his or her mouth. Bring the baby to your breast. Make sure that much of the areola is in your baby's mouth. Offer one side and burp the baby before you offer the other side.  Talk with your health care provider or lactation consultant if you have questions or you face problems as you breastfeed. This information is not intended to replace advice given to you by your health care provider. Make sure you discuss any questions you have with your health care provider. Document Released: 12/31/2004 Document  Revised: 02/02/2016 Document Reviewed: 02/02/2016 Elsevier Interactive Patient Education  [employer]2018 Elsevier Inc. Having a circumcision done in the hospital costs approximately $480.  This will have to be paid in full prior to circumcision being performed.  There are places  to have circumcision done as an outpatient which are cheaper.    Circumcisions      Provider   Phone    Price     ------------------------------------------------------------------------------   [facility]  [phone]  $315 by 4 wks     [facility]   [phone]  $315 by 4 wks     [facility]   [phone]  $175 by 2 wks    [facility]   [phone]  $315 by 7 days [facility]   [phone]  $315 by 4 wks  AREA PEDIATRIC/FAMILY PRACTICE PHYSICIANS  [facility] [address] [place], [place]  [address] Phone - [phone]   Fax - [phone]  [facility] [address] [place], [place] [address] Phone - [phone]   Fax - [phone]  JACK AMOS [address] [place], [place]  [address] Phone - [phone]   Fax - [phone]  [facility] [address] [place], [place]  [address] Phone - [phone]   Fax - [phone]  [facility] [address] [place], [place]  [address] Phone - [phone]   Fax - [phone]  [facility] [address], [address] [place], [place]  [address] Phone - [phone]   Fax - [phone]  [facility] [address], [address] [place], [place]  [address] Phone - [phone]   Fax - [phone]  [facility] [address], [address] [place], [place]  [address] Phone - [phone]   Fax - [phone]  [facility] [address] [place], [place]  [address] Phone - [phone]   Fax - [phone] [facility] [address] [place], [place]  [address] Phone -  [phone]   Fax - [phone]  [facility] [address] [place], [place]  [address] Phone - [phone]   Fax - [phone]  [facility] [address], [address] [place], [place]  [address] Phone - [phone]   Fax - [phone]  [facility] [address] [place], [place]  [address] Phone - [phone]   Fax - [phone]  [facility] [address], [address] [place] [place], [place]  [address] Phone - [phone]  [facility] [address] [place], [place]  [address] Phone - [phone]   Fax - [phone]  [facility] [address], [address] [place], [place]  [address] Phone - [phone]   Fax - [phone]  [facility] [address] [place], [place]  [address] Phone - [phone]   Fax - [phone]  [facility] [address] [place], [place]  [address] Phone - [phone]   Fax - [phone]   [facility] [address] [place], [place]  [address] Phone - [phone]   Fax - [phone]  [facility] [address], [address] [place], [place]  [address] Phone - [phone]   Fax - [phone]  DAVID RUBIN [address], [address] [place], [place]  [address] Phone - [phone]   Fax - [phone]  [facility] [address] [place], [place]  [address] Phone - [phone]   Fax - [phone]  [facility] [address] [place], [place]  [address] Phone - [phone]   Fax - [phone] [facility] [place] [address] [place], [place]  [address] Phone - [phone]   Fax - [phone]  [facility] [address] [place], [place]  [address] Phone - [phone]   Fax - [phone]  [facility] [place] [address] [place], [place]  [address] Phone -  [phone]   Fax - [phone]  [facility] [place] Wyvonne Lenz MD [address] [place] [address] Phone [phone]  Fax [phone]

## 2017-09-01 NOTE — Progress Notes (Signed)
Flu shot

## 2017-09-01 NOTE — Progress Notes (Signed)
   PRENATAL VISIT NOTE  Subjective:  Samantha Knight is a 22 y.o. G1P0000 at 4683w2d being seen today for ongoing prenatal care.  She is currently monitored for the following issues for this low-risk pregnancy and has Supervision of other normal pregnancy, antepartum; BMI 30.0-30.9,adult; and Obesity affecting pregnancy, antepartum on their problem list.  Patient reports contractions since 10 am q 3-4 mins.  Contractions: Regular. Vag. Bleeding: Bloody Show.  Movement: Present. Denies leaking of fluid.   The following portions of the patient's history were reviewed and updated as appropriate: allergies, current medications, past family history, past medical history, past social history, past surgical history and problem list. Problem list updated.  Objective:   Vitals:   09/01/17 1423  BP: 113/70  Pulse: (!) 109  Weight: 189 lb 6.4 oz (85.9 kg)    Fetal Status: Fetal Heart Rate (bpm): 143 Fundal Height: 37 cm Movement: Present  Presentation: Vertex  General:  Alert, oriented and cooperative. Patient is in no acute distress.  Skin: Skin is warm and dry. No rash noted.   Cardiovascular: Normal heart rate noted  Respiratory: Normal respiratory effort, no problems with respiration noted  Abdomen: Soft, gravid, appropriate for gestational age.  Pain/Pressure: Present     Pelvic: Cervical exam performed Dilation: 3 Effacement (%): 80 Station: -2  Extremities: Normal range of motion.  Edema: Trace  Mental Status: Normal mood and affect. Normal behavior. Normal judgment and thought content.   Assessment and Plan:  Pregnancy: G1P0000 at 883w2d  1. Supervision of other normal pregnancy, antepartum Continue routine prenatal care. Labor precautions reviewed at length.  Term labor symptoms and general obstetric precautions including but not limited to vaginal bleeding, contractions, leaking of fluid and fetal movement were reviewed in detail with the patient. Please refer to After Visit  Summary for other counseling recommendations.  Return in about 1 week (around 09/08/2017).  No future appointments.  Reva Boresanya S Takeshia Wenk, MD

## 2017-09-02 LAB — CBC
HEMATOCRIT: 25.1 % — AB (ref 36.0–46.0)
Hemoglobin: 8.1 g/dL — ABNORMAL LOW (ref 12.0–15.0)
MCH: 26.6 pg (ref 26.0–34.0)
MCHC: 32.3 g/dL (ref 30.0–36.0)
MCV: 82.6 fL (ref 78.0–100.0)
PLATELETS: 202 10*3/uL (ref 150–400)
RBC: 3.04 MIL/uL — AB (ref 3.87–5.11)
RDW: 14.8 % (ref 11.5–15.5)
WBC: 18.2 10*3/uL — ABNORMAL HIGH (ref 4.0–10.5)

## 2017-09-02 LAB — RPR: RPR Ser Ql: NONREACTIVE

## 2017-09-02 NOTE — Progress Notes (Addendum)
Post Partum Day 1 Subjective: no complaints, up ad lib, voiding and + flatus  Objective: Blood pressure 110/76, pulse 80, temperature 98.4 F (36.9 C), temperature source Oral, resp. rate 18, height 5\' 2"  (1.575 m), weight 85.7 kg, last menstrual period 11/08/2016, unknown if currently breastfeeding.  Physical Exam:  General: alert, cooperative and no distress Lochia: appropriate Uterine Fundus: firm Incision: N/A DVT Evaluation: No cords or calf tenderness. No significant calf/ankle edema.  Recent Labs    09/01/17 2025 09/02/17 0553  HGB 9.1* 8.1*  HCT 28.8* 25.1*    Assessment/Plan: Discharge home  - Patient checking if private insurance [facility]([employer]) will cover inpatient circumcision - Condoms, considering OCPs later - Iron supplementation   LOS: 1 day   Margarita RanaHarrison D Rose 09/02/2017, 7:45 AM   Clayton BiblesSamantha Richard Ritchey, CNM 09/02/17  5:16 PM

## 2017-09-02 NOTE — Lactation Note (Signed)
This note was copied from a baby's chart. Lactation Consultation Note  Patient Name: Samantha Knight Date: 09/02/2017    [facility]  Mother's feeding choice on admission was breast.  However, upon review of the chart, I noticed she has only been bottle feeding.  Mother verified this with my consult today.  LC services are not needed.                Gabriella Guile R Briahnna Harries 09/02/2017, 10:40 AM

## 2017-09-03 ENCOUNTER — Other Ambulatory Visit: Payer: Self-pay

## 2017-09-03 LAB — CBC
HCT: 27 % — ABNORMAL LOW (ref 36.0–46.0)
Hemoglobin: 8.5 g/dL — ABNORMAL LOW (ref 12.0–15.0)
MCH: 26 pg (ref 26.0–34.0)
MCHC: 31.5 g/dL (ref 30.0–36.0)
MCV: 82.6 fL (ref 78.0–100.0)
PLATELETS: 213 10*3/uL (ref 150–400)
RBC: 3.27 MIL/uL — ABNORMAL LOW (ref 3.87–5.11)
RDW: 14.9 % (ref 11.5–15.5)
WBC: 18.1 10*3/uL — AB (ref 4.0–10.5)

## 2017-09-03 MED ORDER — IBUPROFEN 600 MG PO TABS: 600.0000 mg | ORAL_TABLET | Freq: Four times a day (QID) | ORAL | 0 refills | Status: DC

## 2017-09-03 MED ORDER — OXYCODONE HCL 5 MG PO TABS: 5.0000 mg | ORAL_TABLET | Freq: Every day | ORAL | 0 refills | Status: DC | PRN

## 2017-09-03 MED ORDER — PRENATAL MULTIVITAMIN CH: 1.0000 | ORAL_TABLET | Freq: Every day | ORAL | 0 refills | Status: DC

## 2017-09-03 NOTE — Plan of Care (Signed)
  Problem: Activity: Goal: Will verbalize the importance of balancing activity with adequate rest periods Outcome: Completed/Met  Mother ambulating around room with no issues. Plan on continuing ambulation and rest periods.

## 2017-09-03 NOTE — Discharge Summary (Addendum)
OB Discharge Summary     Patient Name: Samantha PyleShania I Knight DOB: 05/23/1995 MRN: [id number]  Date of admission: 09/01/2017 Delivering MD: Arvilla MarketWALLACE, CATHERINE LAUREN   Date of discharge: 09/03/2017  Admitting diagnosis: 39WKS CTX Intrauterine pregnancy: 6120w2d     Secondary diagnosis:  Active Problems:   Spontaneous vaginal delivery  Additional problems: obesity     Discharge diagnosis: Term Pregnancy Delivered                                                                                                Post partum procedures:none  Augmentation: none  Complications: None  Hospital course:  Onset of Labor With Vaginal Delivery     22 y.o. yo G1P1001 at 6320w2d was admitted in Latent Labor on 09/01/2017. Patient had an uncomplicated labor course as follows:  Membrane Rupture Time/Date: 5:23 PM ,09/01/2017   Intrapartum Procedures: Episiotomy: None [1]                                         Lacerations:  Labial [10]  Patient had a delivery of a Viable infant. 09/01/2017  Information for the patient's newborn:  Polo RileyRutledge, Boy Ronit [[id number]][[id number]]  Delivery Method: Vag-Spont    Pateint had an uncomplicated postpartum course.  She is ambulating, tolerating a regular diet, passing flatus, and urinating well. Patient is discharged home in stable condition on 09/03/17.   Physical exam  Vitals:   09/02/17 0500 09/02/17 1445 09/02/17 2237 09/03/17 0557  BP: 110/76 112/75 107/71 97/68  Pulse: 80 95 93 80  Resp: 18 18  16   Temp: 98.4 F (36.9 C) 98.4 F (36.9 C) 98 F (36.7 C) 98.1 F (36.7 C)  TempSrc: Oral Oral Oral Oral  SpO2:   100%   Weight:      Height:       General: alert, cooperative and no distress Lochia: appropriate Uterine Fundus: firm Incision: Healing well with no significant drainage DVT Evaluation: No evidence of DVT seen on physical exam. No cords or calf tenderness. Labs: Lab Results  Component Value Date   WBC 18.1 (H) 09/03/2017   HGB 8.5 (L) 09/03/2017    HCT 27.0 (L) 09/03/2017   MCV 82.6 09/03/2017   PLT 213 09/03/2017   CMP Latest Ref Rng & Units 08/16/2014  Glucose 65 - 99 mg/dL 88  BUN 6 - 20 mg/dL 4(L)  Creatinine 7.820.44 - 1.00 mg/dL 9.560.80  Sodium 213135 - 086145 mmol/L 140  Potassium 3.5 - 5.1 mmol/L 3.5  Chloride 101 - 111 mmol/L 104    Discharge instruction: per After Visit Summary and "Baby and Me Booklet".  After visit meds:  Allergies as of 09/03/2017   No Known Allergies     Medication List    TAKE these medications   acetaminophen 500 MG tablet Commonly known as:  TYLENOL Take 1,000 mg by mouth every 6 (six) hours as needed for moderate pain.   diphenhydrAMINE 25 MG tablet Commonly known as:  BENADRYL Take 25 mg by  mouth every 6 (six) hours as needed for allergies.   ibuprofen 600 MG tablet Commonly known as:  ADVIL,MOTRIN Take 1 tablet (600 mg total) by mouth every 6 (six) hours.   oxyCODONE 5 MG immediate release tablet Commonly known as:  Oxy IR/ROXICODONE Take 1 tablet (5 mg total) by mouth daily as needed for severe pain.   prenatal multivitamin Tabs tablet Take 1 tablet by mouth daily at 12 noon.       Diet: routine diet  Activity: Advance as tolerated. Pelvic rest for 6 weeks.   Outpatient follow up:2 weeks Follow up Appt: Future Appointments  Date Time Provider Department Center  10/06/2017  1:30 PM Anyanwu, Jethro BastosUgonna A, MD [facility]   Follow up Visit:No follow-ups on file.  Postpartum contraception: Undecided  Newborn Data: Live born female  Birth Weight: 6 lb 11.6 oz (3050 g) APGAR: 8, 9  Newborn Delivery   Birth date/time:  09/01/2017 17:45:00 Delivery type:  Vaginal, Spontaneous     Baby Feeding: Bottle Disposition:home with mother   09/03/2017 Marthenia RollingScott Bland, DO   I confirm that I have verified the information documented in the resident's note and that I have also personally reperformed the physical exam and all medical decision making activities.  Sharyon Cable.Aala Ransom C Joram Venson,  CNM 09/03/17, 2:01 PM

## 2017-10-01 NOTE — Progress Notes (Deleted)
   Patient did not show up today for her scheduled appointment.   UGONNA  ANYANWU, MD, FACOG Obstetrician & Gynecologist, [facility], [facility]  

## 2017-10-06 ENCOUNTER — Ambulatory Visit: Payer: Managed Care, Other (non HMO) | Admitting: Obstetrics & Gynecology

## 2017-10-15 ENCOUNTER — Encounter: Payer: Self-pay | Admitting: Advanced Practice Midwife

## 2017-10-15 ENCOUNTER — Ambulatory Visit (INDEPENDENT_AMBULATORY_CARE_PROVIDER_SITE_OTHER): Payer: Managed Care, Other (non HMO) | Admitting: Advanced Practice Midwife

## 2017-10-15 DIAGNOSIS — Z1389 Encounter for screening for other disorder: Secondary | ICD-10-CM

## 2017-10-15 MED ORDER — DOCUSATE SODIUM 100 MG PO CAPS: 100.0000 mg | ORAL_CAPSULE | Freq: Two times a day (BID) | ORAL | 0 refills | Status: DC

## 2017-10-15 MED ORDER — POLYETHYLENE GLYCOL 3350 17 G PO PACK: 17.0000 g | PACK | Freq: Every day | ORAL | 0 refills | Status: DC

## 2017-10-15 NOTE — Progress Notes (Signed)
Post Partum Exam  Samantha Knight is a 22 y.o. G7P1001 female who presents for a postpartum visit. She is 5 weeks postpartum following a spontaneous vaginal delivery on 09/01/2017 (EBL , hemostatic labial tears) . I have fully reviewed the prenatal and intrapartum course. The delivery was at 39.2 gestational weeks.  Anesthesia: none. Postpartum course has been uncomplicated. Baby's course has been uncomplicated. Baby is feeding by bottle - Carn[employer] Good Start. Bleeding no bleeding. Bowel function is normal. Bladder function is normal. Patient is not sexually active. Contraception method is condoms. Postpartum depression screening:neg   Last pap smear done 02/19/2017 and was Normal  Review of Systems Pertinent items noted in HPI and remainder of comprehensive ROS otherwise negative.    Objective:  Blood pressure 128/81, pulse 89, height 5\' 2"  (1.575 m), weight 166 lb (75.3 kg), last menstrual period 11/08/2016, unknown if currently breastfeeding.  General:  alert, cooperative, appears stated age and no distress   Breasts:  inspection negative, no nipple discharge or bleeding, no masses or nodularity palpable  Lungs: clear to auscultation bilaterally  Heart:  regular rate and rhythm, S1, S2 normal, no murmur, click, rub or gallop  Abdomen: soft, non-tender; bowel sounds normal; no masses,  no organomegaly   Vulva:  normal  Vagina: normal vagina, no discharge, exudate, lesion, or erythema  Cervix:  no cervical motion tenderness and no lesions  Corpus: not examined  Adnexa:  no mass, fullness, tenderness  Rectal Exam: Not performed.        Assessment:    Normal postpartum exam. Pap smear not done at today's visit.   Plan:   1. Contraception: condoms. Discussed LARCs and most common methods, perfect vs typical use, referred to [employer] for      comparison tool 2. EPDS = zero 3. Discussed slow reintroduction of exercise and intercourse 4. Follow up in: 1 year or sooner as  needed for contraception  Clayton Bibles, CNM 10/15/17 1:52 PM

## 2017-10-16 ENCOUNTER — Encounter (HOSPITAL_COMMUNITY): Payer: Self-pay | Admitting: *Deleted

## 2017-10-16 ENCOUNTER — Inpatient Hospital Stay (HOSPITAL_COMMUNITY)
Admission: AD | Admit: 2017-10-16 | Discharge: 2017-10-16 | Disposition: A | Discharge status: Home / Self Care | Payer: Managed Care, Other (non HMO) | Source: Ambulatory Visit | Attending: Obstetrics & Gynecology | Admitting: Obstetrics & Gynecology

## 2017-10-16 DIAGNOSIS — R1030 Lower abdominal pain, unspecified: Secondary | ICD-10-CM | POA: Insufficient documentation

## 2017-10-16 DIAGNOSIS — N939 Abnormal uterine and vaginal bleeding, unspecified: Secondary | ICD-10-CM | POA: Insufficient documentation

## 2017-10-16 DIAGNOSIS — Z5329 Procedure and treatment not carried out because of patient's decision for other reasons: Secondary | ICD-10-CM | POA: Diagnosis not present

## 2017-10-16 NOTE — MAU Note (Signed)
Urine in lab  poct done

## 2017-10-16 NOTE — MAU Note (Signed)
Patient not in lobby X2 

## 2017-10-16 NOTE — MAU Note (Signed)
Patient not in lobby X1 

## 2017-10-16 NOTE — MAU Note (Signed)
Pt reports she had a vaginal delivery one month ago (09/01/2017), states she began having vaginal bleeding and lower abd cramping today.

## 2017-10-16 NOTE — MAU Note (Signed)
Not in lobby X3

## 2017-10-20 ENCOUNTER — Encounter: Payer: Self-pay | Admitting: Obstetrics and Gynecology

## 2017-10-20 ENCOUNTER — Ambulatory Visit (INDEPENDENT_AMBULATORY_CARE_PROVIDER_SITE_OTHER): Payer: Managed Care, Other (non HMO) | Admitting: Obstetrics and Gynecology

## 2017-10-20 VITALS — BP 115/77 | HR 80 | Resp 16 | Ht 62.0 in | Wt 171.2 lb

## 2017-10-20 DIAGNOSIS — L989 Disorder of the skin and subcutaneous tissue, unspecified: Secondary | ICD-10-CM

## 2017-10-20 DIAGNOSIS — R102 Pelvic and perineal pain unspecified side: Secondary | ICD-10-CM

## 2017-10-20 NOTE — Patient Instructions (Signed)
Kegel exercises  Try benadryl cream on your leg bumps

## 2017-10-20 NOTE — Progress Notes (Signed)
Obstetrics and Gynecology Visit Return Patient Evaluation  Appointment Date: 10/21/2017  Primary Care Provider: Patient, No Pcp Per  OB[facility]N Clinic: [facility]  Chief Complaint: pelvic, vaginal pain  History of Present Illness:  Samantha Knight is a 22 y.o. G1P1 s/p 09/01/17 SVD/intact perineum. She had normal 10/2 PP exam and ROS. She states it started about a week ago and has discomfort which feels like after she had the baby. Hard to describe but states it feels like the muscles are tight. No dyspareunia. LMP last week.   Patient also noted some "hip bumps" on both hips  Review of Systems:  as noted in the History of Present Illness.  Medications:  Kenley I. Parks had no medications administered during this visit. Current Outpatient Medications  Medication Sig Dispense Refill  . acetaminophen (TYLENOL) 500 MG tablet Take 1,000 mg by mouth every 6 (six) hours as needed for moderate pain.    . diphenhydrAMINE (BENADRYL) 25 MG tablet Take 25 mg by mouth every 6 (six) hours as needed for allergies.    Marland Kitchen docusate sodium (COLACE) 100 MG capsule Take 1 capsule (100 mg total) by mouth 2 (two) times daily. 10 capsule 0  . ibuprofen (ADVIL,MOTRIN) 600 MG tablet Take 1 tablet (600 mg total) by mouth every 6 (six) hours. 30 tablet 0  . polyethylene glycol (MIRALAX) packet Take 17 g by mouth daily. 14 each 0  . Prenatal Vit-Fe Fumarate-FA (PRENATAL MULTIVITAMIN) TABS tablet Take 1 tablet by mouth daily at 12 noon. 30 tablet 0   No current facility-administered medications for this visit.     Allergies: has No Known Allergies.  Physical Exam:  BP 115/77 (BP Location: Left Arm, Patient Position: Sitting, Cuff Size: Normal)   Pulse 80   Resp 16   Ht 5\' 2"  (1.575 m)   Wt 171 lb 3.2 oz (77.7 kg)   LMP 10/16/2017 (Exact Date)   Breastfeeding? No   BMI 31.31 kg/m  Body mass index is 31.31 kg/m. General appearance: Well nourished, well developed female in no  acute distress.  Abdomen: diffusely non tender to palpation, non distended, and no masses, hernias Neuro/Psych:  Normal mood and affect.   LE: on b/l hips there is some edema and peau d'orange dimpling that looks like an allergic reaction  Pelvic exam:  EGBUS, vaginal vault and cervix: within normal limits. Scant old blood in vault, no CMT. Muscle felt very weak with two finger exam and asking pt to contract pelvic floor and ?spasm, quivering with left vaginal wall   Assessment: ?vaginal spasm, wekaness  Plan:  1. Vaginal pain Recommend seeing pelvic floor physical therapy to have them assess and pt is amenable to plan - Ambulatory referral to Physical Therapy  2. Skin lesion OTC benadryl cream  RTC: PRN  Cornelia Copa MD Attending Center for [facility])

## 2017-10-20 NOTE — Progress Notes (Signed)
Patient reports vaginal pain  - muscles hurt like when she had her baby that has gotten since her menstrual cycle. PS: 6/10 - dull aching constant pain. She reports the pain is with her until she goes to bed then begins immediately after using the restroom. Patient denies fever, burning or frequent urination, vaginal discharge.  Patient also report bites or rash bilateral hips, right forearm, bilateral shins 2-3 days ago.

## 2017-10-21 DIAGNOSIS — R102 Pelvic and perineal pain: Secondary | ICD-10-CM | POA: Insufficient documentation

## 2017-10-21 DIAGNOSIS — L989 Disorder of the skin and subcutaneous tissue, unspecified: Secondary | ICD-10-CM | POA: Insufficient documentation

## 2018-09-24 ENCOUNTER — Other Ambulatory Visit: Payer: Self-pay

## 2018-09-24 ENCOUNTER — Other Ambulatory Visit: Payer: Managed Care, Other (non HMO)

## 2018-09-24 VITALS — BP 111/75 | HR 72

## 2018-09-24 DIAGNOSIS — Z3201 Encounter for pregnancy test, result positive: Secondary | ICD-10-CM

## 2018-09-24 NOTE — Progress Notes (Signed)
Ms. Tietje presents today for UPT. She reports having a positive pregnancy test at home.   No  unusual complaints at this time LMP: 09/13/2018    OBJECTIVE: Appears well, in no apparent distress.  OB History    Gravida  1   Para  1   Term  1   Preterm      AB      Living  1     SAB      TAB      Ectopic      Multiple  0   Live Births  1          Home UPT Result: positive  In-Office UPT result:positive  I have reviewed the patient's medical, obstetrical, social, and family histories, and medications.   ASSESSMENT: Positive pregnancy test  PLAN Prenatal care to be completed at: Between 10-12 weeks to start care. She should begin taking prenatal vitamins.

## 2018-09-29 ENCOUNTER — Telehealth: Payer: Self-pay

## 2018-09-29 NOTE — Telephone Encounter (Signed)
Call patient to follow up on message received from on call nurse concerning patient who is experiencing  nausea and dizziness. She is [redacted] weeks pregnant. No answer or voice mail to leave a message.

## 2018-10-18 ENCOUNTER — Encounter (HOSPITAL_COMMUNITY): Payer: Self-pay

## 2018-10-18 ENCOUNTER — Other Ambulatory Visit: Payer: Self-pay

## 2018-10-18 ENCOUNTER — Inpatient Hospital Stay (HOSPITAL_COMMUNITY)
Admission: AD | Admit: 2018-10-18 | Discharge: 2018-10-18 | Disposition: A | Discharge status: Home / Self Care | Payer: Managed Care, Other (non HMO) | Attending: Obstetrics and Gynecology | Admitting: Obstetrics and Gynecology

## 2018-10-18 ENCOUNTER — Inpatient Hospital Stay (HOSPITAL_COMMUNITY): Payer: Managed Care, Other (non HMO)

## 2018-10-18 DIAGNOSIS — O418X10 Other specified disorders of amniotic fluid and membranes, first trimester, not applicable or unspecified: Secondary | ICD-10-CM | POA: Diagnosis not present

## 2018-10-18 DIAGNOSIS — O26891 Other specified pregnancy related conditions, first trimester: Secondary | ICD-10-CM | POA: Diagnosis not present

## 2018-10-18 DIAGNOSIS — O468X1 Other antepartum hemorrhage, first trimester: Secondary | ICD-10-CM | POA: Diagnosis not present

## 2018-10-18 DIAGNOSIS — O99611 Diseases of the digestive system complicating pregnancy, first trimester: Secondary | ICD-10-CM | POA: Diagnosis not present

## 2018-10-18 DIAGNOSIS — K117 Disturbances of salivary secretion: Secondary | ICD-10-CM | POA: Diagnosis not present

## 2018-10-18 DIAGNOSIS — Z3A08 8 weeks gestation of pregnancy: Secondary | ICD-10-CM | POA: Insufficient documentation

## 2018-10-18 DIAGNOSIS — O210 Mild hyperemesis gravidarum: Secondary | ICD-10-CM | POA: Insufficient documentation

## 2018-10-18 DIAGNOSIS — O3481 Maternal care for other abnormalities of pelvic organs, first trimester: Secondary | ICD-10-CM | POA: Insufficient documentation

## 2018-10-18 DIAGNOSIS — Z87891 Personal history of nicotine dependence: Secondary | ICD-10-CM | POA: Diagnosis not present

## 2018-10-18 DIAGNOSIS — N8311 Corpus luteum cyst of right ovary: Secondary | ICD-10-CM | POA: Insufficient documentation

## 2018-10-18 DIAGNOSIS — O208 Other hemorrhage in early pregnancy: Secondary | ICD-10-CM | POA: Insufficient documentation

## 2018-10-18 DIAGNOSIS — R109 Unspecified abdominal pain: Secondary | ICD-10-CM

## 2018-10-18 DIAGNOSIS — O219 Vomiting of pregnancy, unspecified: Secondary | ICD-10-CM

## 2018-10-18 DIAGNOSIS — Z3480 Encounter for supervision of other normal pregnancy, unspecified trimester: Secondary | ICD-10-CM

## 2018-10-18 LAB — URINALYSIS, ROUTINE W REFLEX MICROSCOPIC
Bacteria, UA: NONE SEEN
Bilirubin Urine: NEGATIVE
Glucose, UA: NEGATIVE mg/dL
Hgb urine dipstick: NEGATIVE
Ketones, ur: 5 mg/dL — AB
Leukocytes,Ua: NEGATIVE
Nitrite: NEGATIVE
Protein, ur: 30 mg/dL — AB
Specific Gravity, Urine: 1.018 (ref 1.005–1.030)
pH: 8 (ref 5.0–8.0)

## 2018-10-18 LAB — CBC
HCT: 34.8 % — ABNORMAL LOW (ref 36.0–46.0)
Hemoglobin: 11.9 g/dL — ABNORMAL LOW (ref 12.0–15.0)
MCH: 30.7 pg (ref 26.0–34.0)
MCHC: 34.2 g/dL (ref 30.0–36.0)
MCV: 89.9 fL (ref 80.0–100.0)
Platelets: 225 10*3/uL (ref 150–400)
RBC: 3.87 MIL/uL (ref 3.87–5.11)
RDW: 14.3 % (ref 11.5–15.5)
WBC: 5.2 10*3/uL (ref 4.0–10.5)
nRBC: 0 % (ref 0.0–0.2)

## 2018-10-18 LAB — WET PREP, GENITAL
Clue Cells Wet Prep HPF POC: NONE SEEN
Sperm: NONE SEEN
Sperm: NONE SEEN
Trich, Wet Prep: NONE SEEN
Trich, Wet Prep: NONE SEEN
Yeast Wet Prep HPF POC: NONE SEEN
Yeast Wet Prep HPF POC: NONE SEEN

## 2018-10-18 LAB — HIV ANTIBODY (ROUTINE TESTING W REFLEX): HIV Screen 4th Generation wRfx: NONREACTIVE

## 2018-10-18 LAB — POCT PREGNANCY, URINE: Preg Test, Ur: POSITIVE — AB

## 2018-10-18 LAB — HCG, QUANTITATIVE, PREGNANCY: hCG, Beta Chain, Quant, S: 87853 m[IU]/mL — ABNORMAL HIGH (ref <5)

## 2018-10-18 MED ORDER — PROMETHAZINE HCL 25 MG PO TABS: 25.0000 mg | ORAL_TABLET | Freq: Four times a day (QID) | ORAL | 2 refills | Status: DC | PRN

## 2018-10-18 MED ORDER — SODIUM CHLORIDE 0.9 % IV SOLN
25.0000 mg | Freq: Once | INTRAVENOUS | Status: AC
  Administered 2018-10-18: 25 mg via INTRAVENOUS
  Filled 2018-10-18: qty 1

## 2018-10-18 MED ORDER — GLYCOPYRROLATE 2 MG PO TABS: 2.0000 mg | ORAL_TABLET | Freq: Three times a day (TID) | ORAL | 3 refills | Status: DC | PRN

## 2018-10-18 MED ORDER — CONCEPT OB 130-92.4-1 MG PO CAPS: 1.0000 | ORAL_CAPSULE | Freq: Every day | ORAL | 12 refills | Status: DC

## 2018-10-18 NOTE — Discharge Instructions (Signed)
Morning Sickness ° °Morning sickness is when a woman feels nauseous during pregnancy. This nauseous feeling may or may not come with vomiting. It often occurs in the morning, but it can be a problem at any time of day. Morning sickness is most common during the first trimester. In some cases, it may continue throughout pregnancy. Although morning sickness is unpleasant, it is usually harmless unless the woman develops severe and continual vomiting (hyperemesis gravidarum), a condition that requires more intense treatment. °What are the causes? °The exact cause of this condition is not known, but it seems to be related to normal hormonal changes that occur in pregnancy. °What increases the risk? °You are more likely to develop this condition if: °· You experienced nausea or vomiting before your pregnancy. °· You had morning sickness during a previous pregnancy. °· You are pregnant with more than one baby, such as twins. °What are the signs or symptoms? °Symptoms of this condition include: °· Nausea. °· Vomiting. °How is this diagnosed? °This condition is usually diagnosed based on your signs and symptoms. °How is this treated? °In many cases, treatment is not needed for this condition. Making some changes to what you eat may help to control symptoms. Your health care provider may also prescribe or recommend: °· Vitamin B6 supplements. °· Anti-nausea medicines. °· Ginger. °Follow these instructions at home: °Medicines °· Take over-the-counter and prescription medicines only as told by your health care provider. Do not use any prescription, over-the-counter, or herbal medicines for morning sickness without first talking with your health care provider. °· Taking multivitamins before getting pregnant can prevent or decrease the severity of morning sickness in most women. °Eating and drinking °· Eat a piece of dry toast or crackers before getting out of bed in the morning. °· Eat 5 or 6 small meals a day. °· Eat dry and  bland foods, such as rice or a baked potato. Foods that are high in carbohydrates are often helpful. °· Avoid greasy, fatty, and spicy foods. °· Have someone cook for you if the smell of any food causes nausea and vomiting. °· If you feel nauseous after taking prenatal vitamins, take the vitamins at night or with a snack. °· Snack on protein foods between meals if you are hungry. Nuts, yogurt, and cheese are good options. °· Drink fluids throughout the day. °· Try ginger ale made with real ginger, ginger tea made from fresh grated ginger, or ginger candies. °General instructions °· Do not use any products that contain nicotine or tobacco, such as cigarettes and e-cigarettes. If you need help quitting, ask your health care provider. °· Get an air purifier to keep the air in your house free of odors. °· Get plenty of fresh air. °· Try to avoid odors that trigger your nausea. °· Consider trying these methods to help relieve symptoms: °? Wearing an acupressure wristband. These wristbands are often worn for seasickness. °? Acupuncture. °Contact a health care provider if: °· Your home remedies are not working and you need medicine. °· You feel dizzy or light-headed. °· You are losing weight. °Get help right away if: °· You have persistent and uncontrolled nausea and vomiting. °· You faint. °· You have severe pain in your abdomen. °Summary °· Morning sickness is when a woman feels nauseous during pregnancy. This nauseous feeling may or may not come with vomiting. °· Morning sickness is most common during the first trimester. °· It often occurs in the morning, but it can be a problem at   any time of day. °· In many cases, treatment is not needed for this condition. Making some changes to what you eat may help to control symptoms. °This information is not intended to replace advice given to you by your health care provider. Make sure you discuss any questions you have with your health care provider. °Document Released:  02/21/2006 Document Revised: 12/13/2016 Document Reviewed: 02/03/2016 °Elsevier Patient Education © 2020 [employer] ° ° ° ° °Subchorionic Hematoma ° °A subchorionic hematoma is a gathering of blood between the outer wall of the embryo (chorion) and the inner wall of the womb (uterus). °This condition can cause vaginal bleeding. If they cause little or no vaginal bleeding, early small hematomas usually shrink on their own and do not affect your baby or pregnancy. When bleeding starts later in pregnancy, or if the hematoma is larger or occurs in older pregnant women, the condition may be more serious. Larger hematomas may get bigger, which increases the chances of miscarriage. This condition also increases the risk of: °· Premature separation of the placenta from the uterus. °· Premature (preterm) labor. °· Stillbirth. °What are the causes? °The exact cause of this condition is not known. It occurs when blood is trapped between the placenta and the uterine wall because the placenta has separated from the original site of implantation. °What increases the risk? °You are more likely to develop this condition if: °· You were treated with fertility medicines. °· You conceived through in vitro fertilization (IVF). °What are the signs or symptoms? °Symptoms of this condition include: °· Vaginal spotting or bleeding. °· Contractions of the uterus. These cause abdominal pain. °Sometimes you may have no symptoms and the bleeding may only be seen when ultrasound images are taken (transvaginal ultrasound). °How is this diagnosed? °This condition is diagnosed based on a physical exam. This includes a pelvic exam. You may also have other tests, including: °· Blood tests. °· Urine tests. °· Ultrasound of the abdomen. °How is this treated? °Treatment for this condition can vary. Treatment may include: °· Watchful waiting. You will be monitored closely for any changes in bleeding. During this stage: °? The hematoma may be  reabsorbed by the body. °? The hematoma may separate the fluid-filled space containing the embryo (gestational sac) from the wall of the womb (endometrium). °· Medicines. °· Activity restriction. This may be needed until the bleeding stops. °Follow these instructions at home: °· Stay on bed rest if told to do so by your health care provider. °· Do not lift anything that is heavier than 10 lbs. (4.5 kg) or as told by your health care provider. °· Do not use any products that contain nicotine or tobacco, such as cigarettes and e-cigarettes. If you need help quitting, ask your health care provider. °· Track and write down the number of pads you use each day and how soaked (saturated) they are. °· Do not use tampons. °· Keep all follow-up visits as told by your health care provider. This is important. Your health care provider may ask you to have follow-up blood tests or ultrasound tests or both. °Contact a health care provider if: °· You have any vaginal bleeding. °· You have a fever. °Get help right away if: °· You have severe cramps in your stomach, back, abdomen, or pelvis. °· You pass large clots or tissue. Save any tissue for your health care provider to look at. °· You have more vaginal bleeding, and you faint or become lightheaded or weak. °Summary °·   A subchorionic hematoma is a gathering of blood between the outer wall of the placenta and the uterus. °· This condition can cause vaginal bleeding. °· Sometimes you may have no symptoms and the bleeding may only be seen when ultrasound images are taken. °· Treatment may include watchful waiting, medicines, or activity restriction. °This information is not intended to replace advice given to you by your health care provider. Make sure you discuss any questions you have with your health care provider. °Document Released: 04/17/2006 Document Revised: 12/13/2016 Document Reviewed: 02/27/2016 °Elsevier Patient Education © 2020 [employer] Inc[employer] ° °

## 2018-10-18 NOTE — MAU Note (Signed)
Samantha Knight is a 23 y.o. at [redacted]w[redacted]d here in MAU reporting: states for the past week she hasn't been able to keep anything down. Emesis x 8-9 in the past 24 hours. Also having lots of saliva. Having intermittent abdominal pain, states it shoots from one side of her abdomen to the other. Not having any pain currently, the last time she had that pain was this AM around 0500. No bleeding or abnormal discharge.  LMP: 08/17/18  Onset of complaint: ongoing  Pain score: 0/10  Vitals:   10/18/18 1248  BP: 114/73  Pulse: 83  Resp: 16  Temp: 99.3 F (37.4 C)  SpO2: 100%      Lab orders placed from triage: UA, UPT

## 2018-10-18 NOTE — MAU Provider Note (Signed)
Chief Complaint: Emesis and Nausea   First Provider Initiated Contact with Patient 10/18/18 1305     SUBJECTIVE HPI: Samantha Knight is a 23 y.o. G2P1001 at [redacted]w[redacted]d who presents to Maternity Admissions reporting N/V x 4 weeks and low abd pain x a few days. Hasn't tried any meds for N/V. No improvement w/ dietary changes. States she hasn't been able to keep anything down for the past few days.   Location: Bilat low abd pain Quality: sharp, cramping Severity: moderate Duration: two days Context: early pregnancy. IUP not verified.  Timing: intermittent.  Modifying factors: More noticeable when lying down Associated signs and symptoms: Pos for N/V. Neg for fever, chills, diarrhea, constipation, urinary complaints.   Past Medical History:  Diagnosis Date  . Asthma    as a child.   . Migraine    OB History  Gravida Para Term Preterm AB Living  2 1 1     1   SAB TAB Ectopic Multiple Live Births        0 1    # Outcome Date GA Lbr Len/2nd Weight Sex Delivery Anes PTL Lv  2 Current           1 Term 09/01/17 [redacted]w[redacted]d 07:12 / 00:33 3050 g M Vag-Spont None  LIV   Past Surgical History:  Procedure Laterality Date  . NO PAST SURGERIES     Social History   Socioeconomic History  . Marital status: Single    Spouse name: Not on file  . Number of children: Not on file  . Years of education: Not on file  . Highest education level: Not on file  Occupational History  . Not on file  Social Needs  . Financial resource strain: Not on file  . Food insecurity    Worry: Not on file    Inability: Not on file  . Transportation needs    Medical: Not on file    Non-medical: Not on file  Tobacco Use  . Smoking status: Former Smoker    Types: Cigars  . Smokeless tobacco: Never Used  . Tobacco comment: quit with +UPT  Substance and Sexual Activity  . Alcohol use: No  . Drug use: No  . Sexual activity: Yes    Birth control/protection: None  Lifestyle  . Physical activity    Days per week:  Not on file    Minutes per session: Not on file  . Stress: Not on file  Relationships  . Social [redacted]w[redacted]d on phone: Not on file    Gets together: Not on file    Attends religious service: Not on file    Active member of club or organization: Not on file    Attends meetings of clubs or organizations: Not on file    Relationship status: Not on file  . Intimate partner violence    Fear of current or ex partner: Not on file    Emotionally abused: Not on file    Physically abused: Not on file    Forced sexual activity: Not on file  Other Topics Concern  . Not on file  Social History Narrative  . Not on file   Family History  Problem Relation Age of Onset  . Cancer Neg Hx   . Diabetes Neg Hx   . Heart disease Neg Hx   . Stroke Neg Hx    No current facility-administered medications on file prior to encounter.    Current Outpatient Medications on File Prior  to Encounter  Medication Sig Dispense Refill  . acetaminophen (TYLENOL) 500 MG tablet Take 1,000 mg by mouth every 6 (six) hours as needed for moderate pain.    . diphenhydrAMINE (BENADRYL) 25 MG tablet Take 25 mg by mouth every 6 (six) hours as needed for allergies.    Marland Kitchen docusate sodium (COLACE) 100 MG capsule Take 1 capsule (100 mg total) by mouth 2 (two) times daily. 10 capsule 0  . ibuprofen (ADVIL,MOTRIN) 600 MG tablet Take 1 tablet (600 mg total) by mouth every 6 (six) hours. (Patient not taking: Reported on 09/24/2018) 30 tablet 0  . polyethylene glycol (MIRALAX) packet Take 17 g by mouth daily. 14 each 0  . Prenatal Vit-Fe Fumarate-FA (PRENATAL MULTIVITAMIN) TABS tablet Take 1 tablet by mouth daily at 12 noon. 30 tablet 0   No Known Allergies  I have reviewed patient's Past Medical Hx, Surgical Hx, Family Hx, Social Hx, medications and allergies.   Review of Systems  Constitutional: Negative for appetite change, chills and fever.  HENT:       Excessive salivation  Gastrointestinal: Positive for abdominal  pain and diarrhea (Loose stools x2 days). Negative for constipation, nausea and vomiting.  Genitourinary: Positive for frequency and pelvic pain. Negative for difficulty urinating, dysuria, flank pain, hematuria, urgency, vaginal bleeding and vaginal discharge.  Musculoskeletal: Negative for back pain.    OBJECTIVE Patient Vitals for the past 24 hrs:  BP Temp Temp src Pulse Resp SpO2 Height Weight  10/18/18 1248 114/73 99.3 F (37.4 C) Oral 83 16 100 % - -  10/18/18 1236 - - - - - -  (1.6 m) 73.7 kg   Constitutional: Well-developed, well-nourished female in no acute distress.  Cardiovascular: normal rate Respiratory: normal rate and effort.  GI: Abd soft, non-tender. Fundus non-palpable MS: Extremities nontender, no edema, normal ROM Neurologic: Alert and oriented x 4.  GU: Neg CVAT.  PELVIC EXAM: NEFG, physiologic discharge, no blood noted, cervix closed, anterior; difficult to assess uterine size due to position. no adnexal tenderness or masses.  No CMT.  LAB RESULTS Results for orders placed or performed during the hospital encounter of 10/18/18 (from the past 24 hour(s))  Urinalysis, Routine w reflex microscopic     Status: Abnormal   Collection Time: 10/18/18 12:19 PM  Result Value Ref Range   Color, Urine YELLOW YELLOW   APPearance CLEAR CLEAR   Specific Gravity, Urine 1.018 1.005 - 1.030   pH 8.0 5.0 - 8.0   Glucose, UA NEGATIVE NEGATIVE mg/dL   Hgb urine dipstick NEGATIVE NEGATIVE   Bilirubin Urine NEGATIVE NEGATIVE   Ketones, ur 5 (A) NEGATIVE mg/dL   Protein, ur 30 (A) NEGATIVE mg/dL   Nitrite NEGATIVE NEGATIVE   Leukocytes,Ua NEGATIVE NEGATIVE   RBC / HPF 0-5 0 - 5 RBC/hpf   WBC, UA 0-5 0 - 5 WBC/hpf   Bacteria, UA NONE SEEN NONE SEEN   Squamous Epithelial / LPF 0-5 0 - 5   Mucus PRESENT   Pregnancy, urine POC     Status: Abnormal   Collection Time: 10/18/18 12:20 PM  Result Value Ref Range   Preg Test, Ur POSITIVE (A) NEGATIVE  hCG, quantitative,  pregnancy     Status: Abnormal   Collection Time: 10/18/18  1:13 PM  Result Value Ref Range   hCG, Beta Chain, Quant, S 87,853 (H) <5 mIU/mL  CBC     Status: Abnormal   Collection Time: 10/18/18  1:13 PM  Result Value Ref Range  WBC 5.2 4.0 - 10.5 K/uL   RBC 3.87 3.87 - 5.11 MIL/uL   Hemoglobin 11.9 (L) 12.0 - 15.0 g/dL   HCT 34.8 (L) 36.0 - 46.0 %   MCV 89.9 80.0 - 100.0 fL   MCH 30.7 26.0 - 34.0 pg   MCHC 34.2 30.0 - 36.0 g/dL   RDW 14.3 11.5 - 15.5 %   Platelets 225 150 - 400 K/uL   nRBC 0.0 0.0 - 0.2 %  Wet prep, genital     Status: Abnormal   Collection Time: 10/18/18  1:15 PM  Result Value Ref Range   Yeast Wet Prep HPF POC NONE SEEN NONE SEEN   Trich, Wet Prep NONE SEEN NONE SEEN   Clue Cells Wet Prep HPF POC PRESENT (A) NONE SEEN   WBC, Wet Prep HPF POC MODERATE (A) NONE SEEN   Sperm NONE SEEN   Wet prep, genital     Status: Abnormal   Collection Time: 10/18/18  2:37 PM  Result Value Ref Range   Yeast Wet Prep HPF POC NONE SEEN NONE SEEN   Trich, Wet Prep NONE SEEN NONE SEEN   Clue Cells Wet Prep HPF POC NONE SEEN NONE SEEN   WBC, Wet Prep HPF POC MANY (A) NONE SEEN   Sperm NONE SEEN     IMAGING 6.6 week Live SIUP, Right C[facility], Small [facility]  MAU COURSE Orders Placed This Encounter  Procedures  . Wet prep, genital  . Wet prep, genital  . US OB Comp Less 14 Wks  . Urinalysis, Routine w reflex microscopic  . hCG, quantitative, pregnancy  . CBC  . HIV Antibody (routine testing w rflx)  . HIV4GL Save Tube  . Pregnancy, urine POC  . Insert peripheral IV   Meds ordered this encounter  Medications  . promethazine (PHENERGAN) 25 mg in sodium chloride 0.9 % 1,000 mL infusion  . promethazine (PHENERGAN) 25 MG tablet    Sig: Take 1 tablet (25 mg total) by mouth every 6 (six) hours as needed for nausea or vomiting.    Dispense:  30 tablet    Refill:  2    Order Specific Question:   Supervising Provider    Answer:   Aletha Halim [id number]  . glycopyrrolate  (ROBINUL) 2 MG tablet    Sig: Take 1 tablet (2 mg total) by mouth 3 (three) times daily as needed.    Dispense:  30 tablet    Refill:  3    Order Specific Question:   Supervising Provider    Answer:   Aletha Halim [id number]  . Prenat w/o A Vit-FeFum-FePo-FA (CONCEPT OB) 130-92.4-1 MG CAPS    Sig: Take 1 tablet by mouth daily.    Dispense:  30 capsule    Refill:  12    Order Specific Question:   Supervising Provider    Answer:   Aletha Halim [[id number]]    MDM - Abd pain in pregnancy w/ live SIUP. Pain possibly 2/2 to [facility] or [facility]. No evidence of ectopic pregnancy, SAB in progress, UTI or infection.  - N/V or pregnancy. Sx controlled w/ Phenergan,. Feeling batter after IV fluids and tolerating PO's.   ASSESSMENT 1. Abdominal pain during pregnancy in first trimester   2. Corpus luteum cyst of right ovary   3. Subchorionic hemorrhage of placenta in first trimester, single or unspecified fetus   4. Nausea and vomiting of pregnancy, antepartum   5. Ptyalism     PLAN Discharge home in stable condition. First  trimester precautions Follow-up Information    [facility].   Specialty: Oncology Why: As scheduled or sooner as needed if symptoms worsen Contact information: [address] [place] [phone]       [facility] Follow up.   Specialty: Obstetrics and Gynecology Why: As needed in pregnancy emergencies Contact information: [address] [id number] [address] [address] [phone]         Allergies as of 10/18/2018   No Known Allergies     Medication List    STOP taking these medications   ibuprofen 600 MG tablet Commonly known as: ADVIL   prenatal multivitamin Tabs tablet     TAKE these medications   acetaminophen 500 MG tablet Commonly known as: TYLENOL Take 1,000 mg by mouth every 6 (six) hours as needed for moderate  pain.   Concept OB 130-92.4-1 MG Caps Take 1 tablet by mouth daily.   diphenhydrAMINE 25 MG tablet Commonly known as: BENADRYL Take 25 mg by mouth every 6 (six) hours as needed for allergies.   docusate sodium 100 MG capsule Commonly known as: Colace Take 1 capsule (100 mg total) by mouth 2 (two) times daily.   glycopyrrolate 2 MG tablet Commonly known as: ROBINUL Take 1 tablet (2 mg total) by mouth 3 (three) times daily as needed.   polyethylene glycol 17 g packet Commonly known as: MiraLax Take 17 g by mouth daily.   promethazine 25 MG tablet Commonly known as: PHENERGAN Take 1 tablet (25 mg total) by mouth every 6 (six) hours as needed for nausea or vomiting.        Katrinka BlazingSmith, [place], CNM 10/18/2018  4:26 PM

## 2018-10-20 LAB — GC/CHLAMYDIA PROBE AMP (~~LOC~~) NOT AT ARMC
Chlamydia: NEGATIVE
Neisseria Gonorrhea: NEGATIVE

## 2018-11-02 ENCOUNTER — Encounter: Payer: Managed Care, Other (non HMO) | Admitting: Obstetrics and Gynecology

## 2018-11-03 ENCOUNTER — Other Ambulatory Visit: Payer: Self-pay

## 2018-11-03 ENCOUNTER — Telehealth: Payer: Self-pay

## 2018-11-03 ENCOUNTER — Encounter: Payer: Self-pay | Admitting: Radiology

## 2018-11-03 ENCOUNTER — Ambulatory Visit (INDEPENDENT_AMBULATORY_CARE_PROVIDER_SITE_OTHER): Payer: Managed Care, Other (non HMO) | Admitting: Family Medicine

## 2018-11-03 VITALS — BP 117/78 | HR 72 | Wt 165.0 lb

## 2018-11-03 DIAGNOSIS — O26891 Other specified pregnancy related conditions, first trimester: Secondary | ICD-10-CM

## 2018-11-03 DIAGNOSIS — R109 Unspecified abdominal pain: Secondary | ICD-10-CM

## 2018-11-03 DIAGNOSIS — Z3480 Encounter for supervision of other normal pregnancy, unspecified trimester: Secondary | ICD-10-CM

## 2018-11-03 DIAGNOSIS — O26899 Other specified pregnancy related conditions, unspecified trimester: Secondary | ICD-10-CM

## 2018-11-03 DIAGNOSIS — Z3A09 9 weeks gestation of pregnancy: Secondary | ICD-10-CM

## 2018-11-03 NOTE — Telephone Encounter (Signed)
Received call from patient concerning some pain she had x 4 days. She reports no bleeding at this time. She has not tried anything for pain at this time. I mention to patient was this pain different then the pain she went to the ER on the 10/18/2018. Patient reports no this is different. I placed patient on hold and attempted to get her scheduled for an appointment today at 11:15.

## 2018-11-03 NOTE — Progress Notes (Signed)
   PRENATAL VISIT NOTE  Subjective:  Samantha Knight is a 23 y.o. G2P1001 at [redacted]w[redacted]d being seen today for ongoing prenatal care.  She is currently monitored for the following issues for this low-risk pregnancy and has BMI 30.0-30.9,adult; Vaginal pain; Skin lesion; and Supervision of other normal pregnancy, antepartum on their problem list.  Patient reports upper abdominal pain bilaterally. Pain is sometimes on right and sometimes on left. She notes that pain radiates to her back. She has been somewhat constipated and has had increasing activity lately by working with [employer]..   Krista Blue. Bleeding: None.   . Denies leaking of fluid.   The following portions of the patient's history were reviewed and updated as appropriate: allergies, current medications, past family history, past medical history, past social history, past surgical history and problem list.   Objective:   Vitals:   11/03/18 1109  BP: 117/78  Pulse: 72  Weight: 165 lb (74.8 kg)    Fetal Status: Fetal Heart Rate (bpm): 162         General:  Alert, oriented and cooperative. Patient is in no acute distress.  Skin: Skin is warm and dry. No rash noted.   Cardiovascular: Normal heart rate noted  Respiratory: Normal respiratory effort, no problems with respiration noted  Abdomen: Soft, gravid, appropriate for gestational age.  Pain/Pressure: Present     Pelvic: Cervical exam deferred        Extremities: Normal range of motion.     Mental Status: Normal mood and affect. Normal behavior. Normal judgment and thought content.   Assessment and Plan:  Pregnancy: G2P1001 at [redacted]w[redacted]d Problem List Items Addressed This Visit      Unprioritized   Supervision of other normal pregnancy, antepartum - Primary    Other Visit Diagnoses    Abdominal pain affecting pregnancy       ? constipation or musculosckeletal. The uterus is low. No bleeding or leaking. She will increase water and rest. Tylenol prn.      General obstetric precautions  including but not limited to vaginal bleeding, contractions, leaking of fluid and fetal movement were reviewed in detail with the patient. Please refer to After Visit Summary for other counseling recommendations.   Return in about 4 weeks (around 12/01/2018).  Future Appointments  Date Time Provider [place]  11/16/2018  2:30 PM Lavonia Drafts, MD [facility]    Donnamae Jude, MD

## 2018-11-03 NOTE — Progress Notes (Signed)
Having pain in lower back and side of her abdomen X 3 days.

## 2018-11-06 IMAGING — US US MFM OB FOLLOW-UP
1 series · 14 of 28 positions shown · non-contrast
Comparison: none

[Series 1: us mfm ob follow-up · 51 acquisitions, 14 frames shown]
[im 2/51]
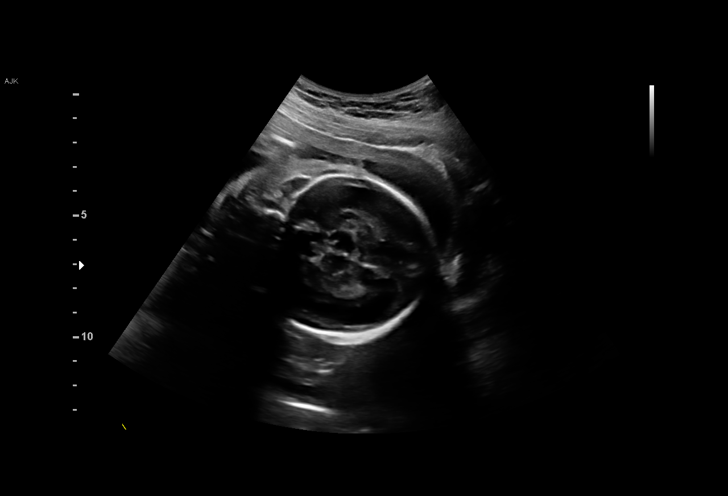
[im 6/51]
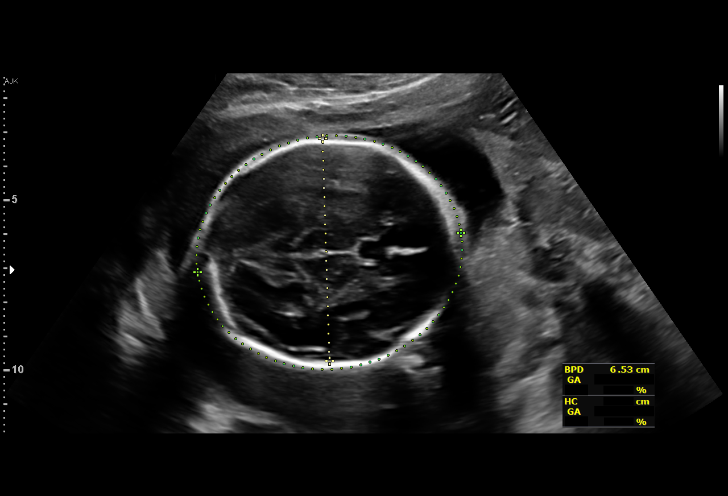
[im 10/51]
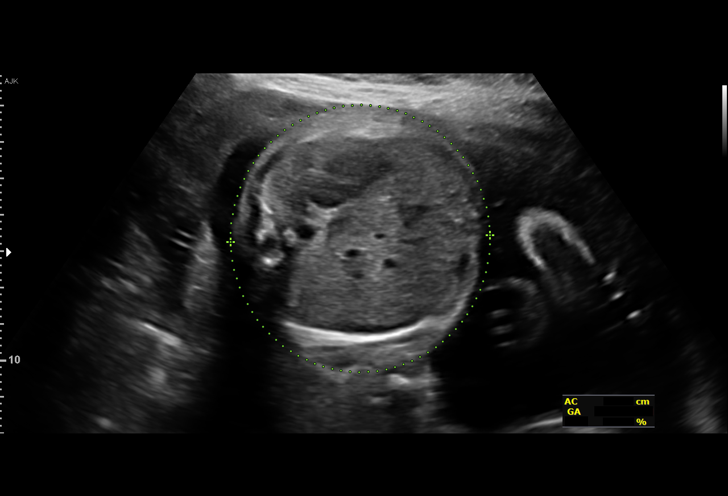
[im 13/51]
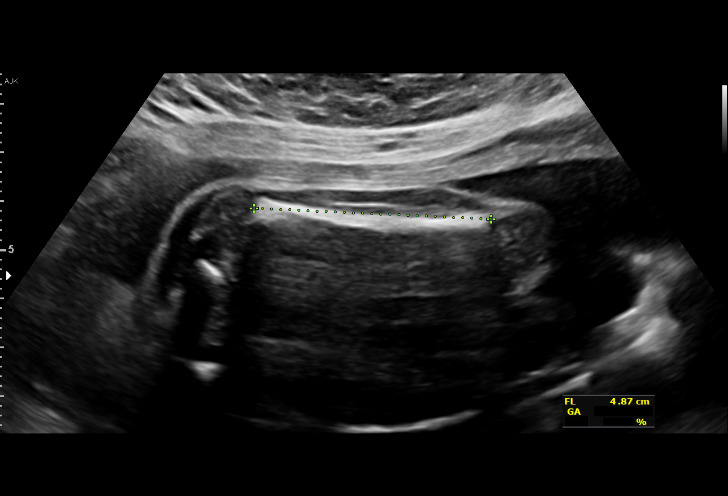
[im 17/51]
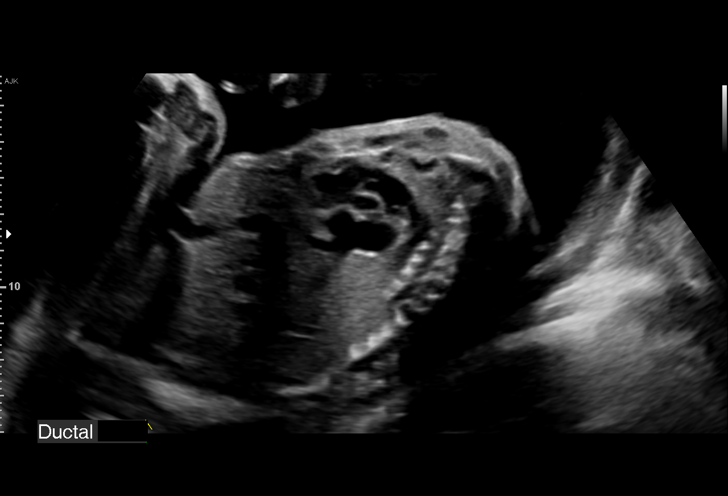
[im 21/51]
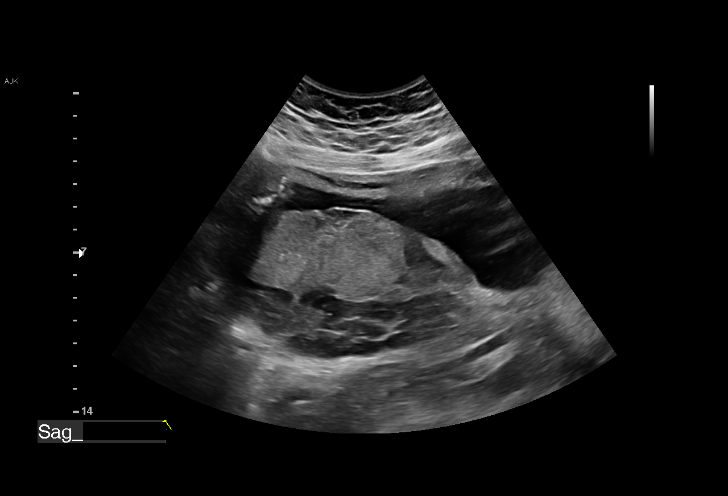
[im 25/51]
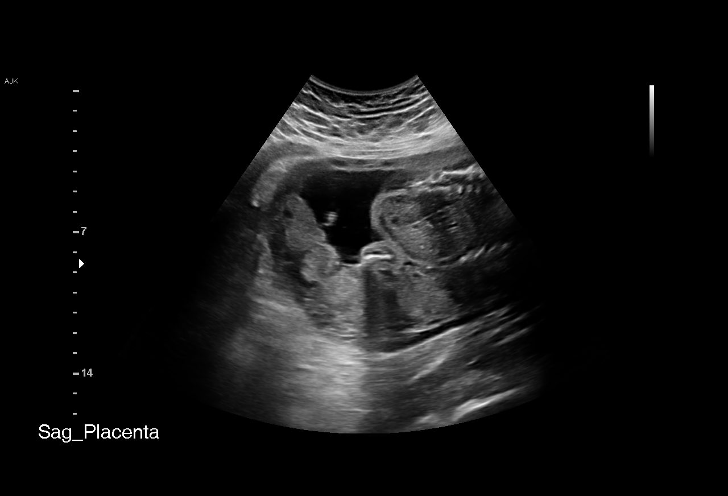
[im 28/51]
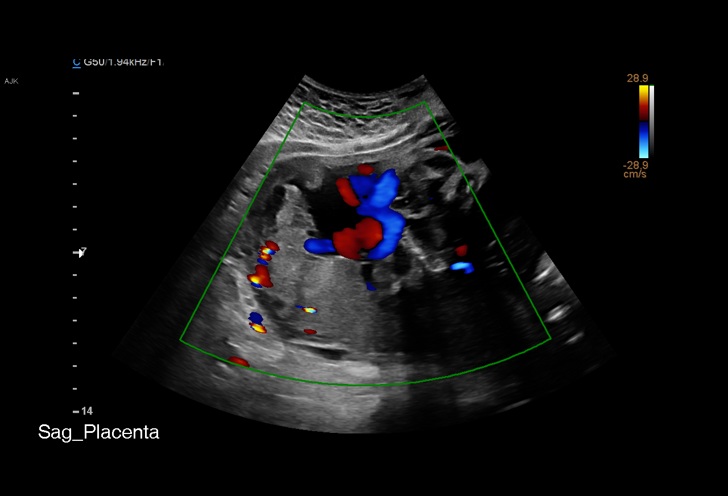
[im 32/51]
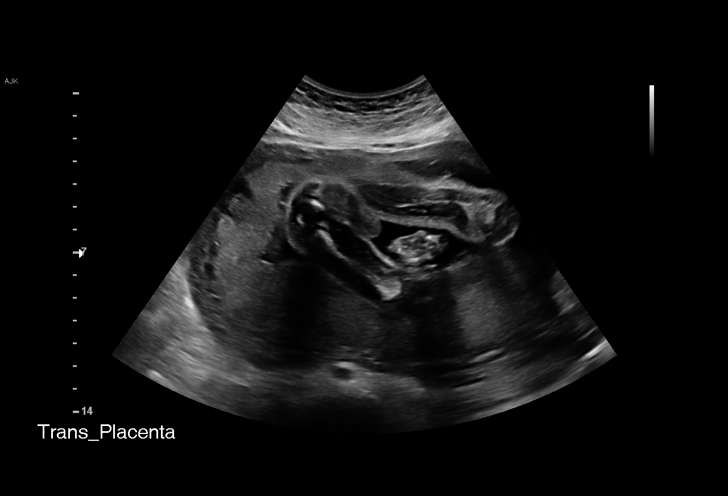
[im 36/51]
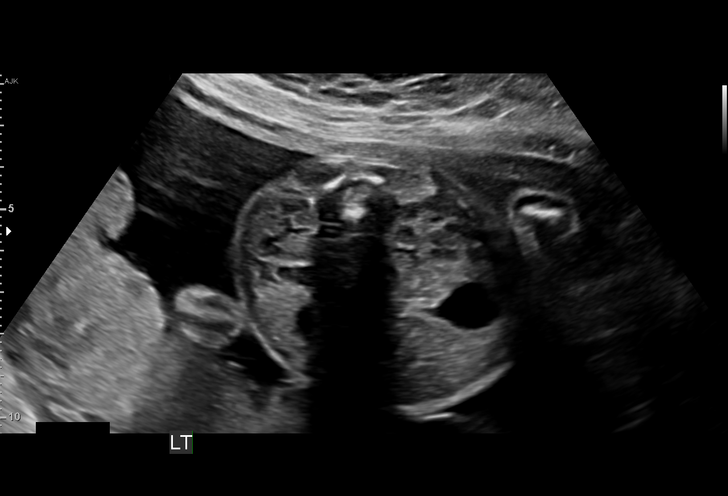
[im 39/51]
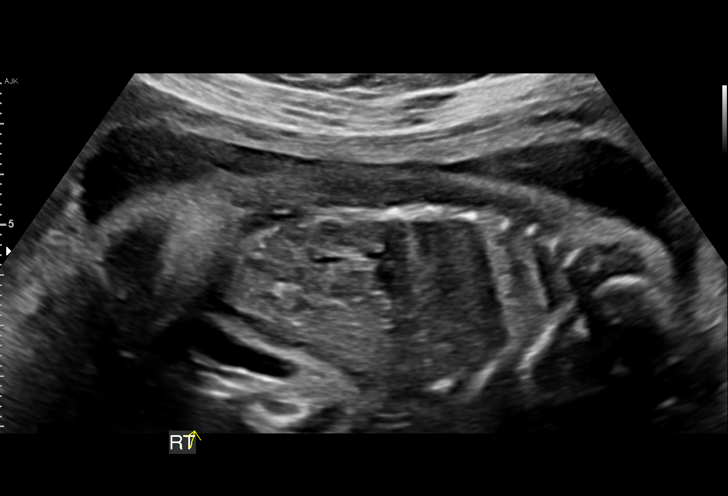
[im 43/51]
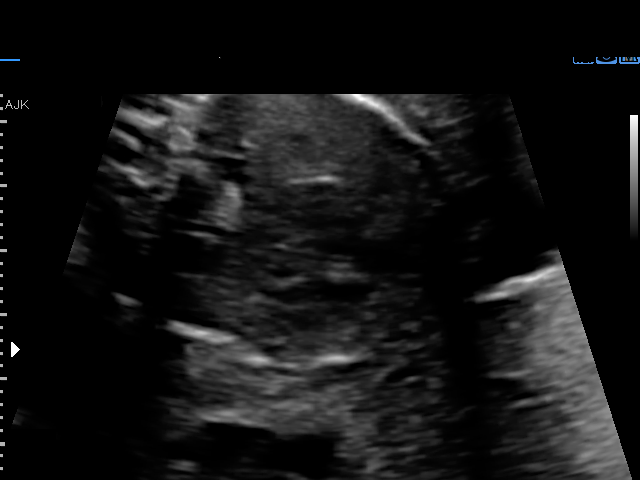
[im 47/51]
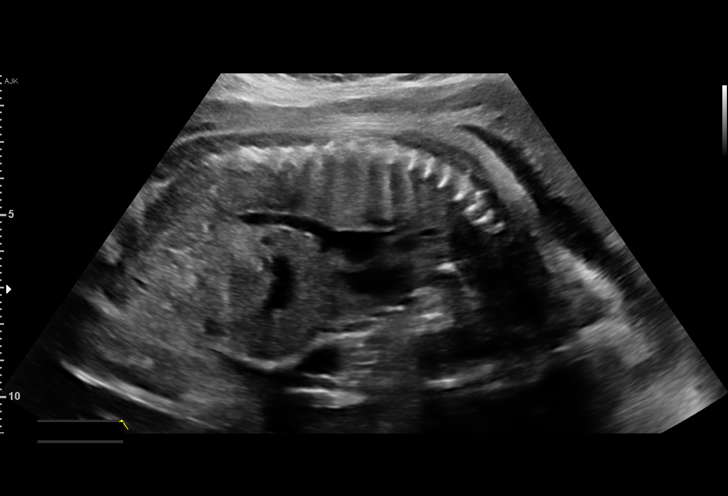
[im 51/51]
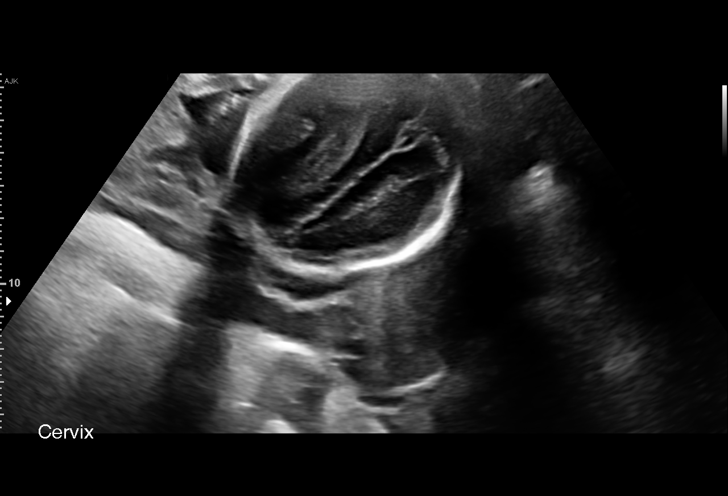

[14 of 28 positions shown; findings below may reference images not displayed]

1  ROSHAN PINON          [id number]      [id number]     [id number]
Indications

25 weeks gestation of pregnancy
Obesity complicating pregnancy, second
trimester
Encounter for other antenatal screening
follow-up
OB History

Blood Type:            Height:  5'2"   Weight (lb):  171       BMI:
Gravidity:    1         Term:   0        Prem:   0        SAB:   0
TOP:          0       Ectopic:  0        Living: 0
Fetal Evaluation

Num Of Fetuses:     1
Fetal Heart         145
Rate(bpm):
Cardiac Activity:   Observed
Presentation:       Cephalic
Placenta:           Posterior, above cervical os
P. Cord Insertion:  Previously Visualized

Amniotic Fluid
AFI FV:      Subjectively within normal limits

Largest Pocket(cm)
4.42
Biometry

BPD:      66.1  mm     G. Age:  26w 4d         77  %    CI:        79.94   %    70 - 86
FL/HC:      20.7   %    18.6 -
HC:      233.6  mm     G. Age:  25w 3d         22  %    HC/AC:      1.05        1.04 -
AC:      223.5  mm     G. Age:  26w 5d         77  %    FL/BPD:     73.2   %    71 - 87
FL:       48.4  mm     G. Age:  26w 2d         57  %    FL/AC:      21.7   %    20 - 24

Est. FW:     936  gm      2 lb 1 oz     71  %
Gestational Age

LMP:           28w 5d        Date:  11/08/16                 EDD:   08/15/17
U/S Today:     26w 2d                                        EDD:   09/01/17
Best:          25w 4d     Det. By:  Early Ultrasound         EDD:   09/06/17
(01/10/17)
Anatomy

Cranium:               Appears normal         Aortic Arch:            Appears normal
Cavum:                 Appears normal         Ductal Arch:            limited views seen
Ventricles:            Appears normal         Diaphragm:              Appears normal
Choroid Plexus:        Previously seen        Stomach:                Appears normal, left
sided
Cerebellum:            Previously seen        Abdomen:                Appears normal
Posterior Fossa:       Previously seen        Abdominal Wall:         Previously seen
Nuchal Fold:           Previously seen        Cord Vessels:           Previously seen
Face:                  Appears normal         Kidneys:                Appear normal
(orbits and profile)
Lips:                  Previously seen        Bladder:                Appears normal
Thoracic:              Appears normal         Spine:                  Previously seen
Heart:                 Previously seen        Upper Extremities:      Previously seen
RVOT:                  Appears normal         Lower Extremities:      Previously seen
LVOT:                  Previously seen

Other:  Fetus appears to be a male. Heels visualized. Nasal bone visualized.
Technically difficult due to fetal position.
Cervix Uterus Adnexa

Cervix
Length:           3.11  cm.
Normal appearance by transabdominal scan.

Uterus
No abnormality visualized.

Left Ovary
Not visualized.

Right Ovary
Not visualized.

Adnexa:       No abnormality visualized.
Impression

Singleton intrauterine pregnancy at 25+4 weeks with obesity
here for completion of the  anatomic survey
Interval review of the anatomy shows no sonographic
markers for aneuploidy or structural anomalies
All relevant fetal anatomy has been visualized
Amniotic fluid volume is normal
Estimated fetal weight shows growth in the 71st percentile
Recommendations

Follow-up ultrasounds as clinically indicated.

## 2018-11-09 ENCOUNTER — Telehealth: Payer: Self-pay

## 2018-11-09 MED ORDER — DOCUSATE SODIUM 100 MG PO CAPS: 100.0000 mg | ORAL_CAPSULE | Freq: Two times a day (BID) | ORAL | 0 refills | Status: DC

## 2018-11-09 MED ORDER — POLYETHYLENE GLYCOL 3350 17 G PO PACK: 17.0000 g | PACK | Freq: Every day | ORAL | 1 refills | Status: DC

## 2018-11-09 NOTE — Telephone Encounter (Signed)
Received message patient is requesting something for constipation. Called in Miralax and colace to help with her symptoms. I attempted to call patient but there was no answer.

## 2018-11-15 ENCOUNTER — Encounter (HOSPITAL_COMMUNITY): Payer: Self-pay

## 2018-11-15 ENCOUNTER — Inpatient Hospital Stay (EMERGENCY_DEPARTMENT_HOSPITAL)
Admission: AD | Admit: 2018-11-15 | Discharge: 2018-11-15 | Disposition: A | Discharge status: Home / Self Care | Payer: Managed Care, Other (non HMO) | Source: Home / Self Care | Attending: Obstetrics & Gynecology | Admitting: Obstetrics & Gynecology

## 2018-11-15 ENCOUNTER — Inpatient Hospital Stay (HOSPITAL_COMMUNITY)
Admission: AD | Admit: 2018-11-15 | Discharge: 2018-11-15 | Disposition: A | Discharge status: Home / Self Care | Payer: Managed Care, Other (non HMO) | Attending: Obstetrics & Gynecology | Admitting: Obstetrics & Gynecology

## 2018-11-15 ENCOUNTER — Other Ambulatory Visit: Payer: Self-pay

## 2018-11-15 DIAGNOSIS — K117 Disturbances of salivary secretion: Secondary | ICD-10-CM

## 2018-11-15 DIAGNOSIS — Z3A10 10 weeks gestation of pregnancy: Secondary | ICD-10-CM | POA: Insufficient documentation

## 2018-11-15 DIAGNOSIS — J45909 Unspecified asthma, uncomplicated: Secondary | ICD-10-CM | POA: Insufficient documentation

## 2018-11-15 DIAGNOSIS — O210 Mild hyperemesis gravidarum: Secondary | ICD-10-CM | POA: Insufficient documentation

## 2018-11-15 DIAGNOSIS — R519 Headache, unspecified: Secondary | ICD-10-CM | POA: Insufficient documentation

## 2018-11-15 DIAGNOSIS — Z79899 Other long term (current) drug therapy: Secondary | ICD-10-CM | POA: Insufficient documentation

## 2018-11-15 DIAGNOSIS — Z87891 Personal history of nicotine dependence: Secondary | ICD-10-CM | POA: Insufficient documentation

## 2018-11-15 DIAGNOSIS — O219 Vomiting of pregnancy, unspecified: Secondary | ICD-10-CM | POA: Diagnosis not present

## 2018-11-15 DIAGNOSIS — G4489 Other headache syndrome: Secondary | ICD-10-CM

## 2018-11-15 DIAGNOSIS — O26891 Other specified pregnancy related conditions, first trimester: Secondary | ICD-10-CM | POA: Insufficient documentation

## 2018-11-15 DIAGNOSIS — O99511 Diseases of the respiratory system complicating pregnancy, first trimester: Secondary | ICD-10-CM | POA: Insufficient documentation

## 2018-11-15 LAB — URINALYSIS, ROUTINE W REFLEX MICROSCOPIC
Bilirubin Urine: NEGATIVE
Glucose, UA: NEGATIVE mg/dL
Hgb urine dipstick: NEGATIVE
Ketones, ur: 80 mg/dL — AB
Leukocytes,Ua: NEGATIVE
Nitrite: NEGATIVE
Protein, ur: 30 mg/dL — AB
Specific Gravity, Urine: 1.026 (ref 1.005–1.030)
pH: 7 (ref 5.0–8.0)

## 2018-11-15 MED ORDER — PROMETHAZINE HCL 25 MG/ML IJ SOLN
25.0000 mg | Freq: Once | INTRAVENOUS | Status: AC
  Administered 2018-11-15: 25 mg via INTRAVENOUS
  Filled 2018-11-15: qty 1

## 2018-11-15 MED ORDER — DOXYLAMINE-PYRIDOXINE 10-10 MG PO TBEC: DELAYED_RELEASE_TABLET | ORAL | 2 refills | Status: DC

## 2018-11-15 MED ORDER — M.V.I. ADULT IV INJ
Freq: Once | INTRAVENOUS | Status: AC
  Administered 2018-11-15: 16:00:00 via INTRAVENOUS
  Filled 2018-11-15: qty 1000

## 2018-11-15 NOTE — MAU Note (Signed)
Samantha Knight is a 23 y.o. at [redacted]w[redacted]d here in MAU reporting: vomiting for 4 days, started having a headache about 3 days ago. No abdominal pain, bleeding, or abnormal discharge.  Onset of complaint: 4 days  Pain score: 10/10  Vitals:   11/15/18 1119  BP: 122/67  Pulse: 87  Resp: 16  Temp: 99.2 F (37.3 C)  SpO2: 100%     FHT: 154  Lab orders placed from triage: UA

## 2018-11-15 NOTE — MAU Provider Note (Signed)
History     CSN: [id number]  Arrival date and time: 11/15/18 1356   First Provider Initiated Contact with Patient 11/15/18 1430      Chief Complaint  Patient presents with  . Nausea  . Emesis  . Headache   HPI Samantha Knight 23 y.o. [redacted]w[redacted]d Returns to MAU today for a second time.  After she left, she did take Phenergan and vomited it. Did check on Diclegis but it was not yet available at the pharmacy.  Was told to check back later today.  Was able to work out transportation issues and returns today for IV fluids.  Urinalysis from earlier today showed >80 ketones.   OB History    Gravida  2   Para  1   Term  1   Preterm      AB      Living  1     SAB      TAB      Ectopic      Multiple  0   Live Births  1           Past Medical History:  Diagnosis Date  . Asthma    as a child.   . Migraine     Past Surgical History:  Procedure Laterality Date  . NO PAST SURGERIES      Family History  Problem Relation Age of Onset  . Cancer Neg Hx   . Diabetes Neg Hx   . Heart disease Neg Hx   . Stroke Neg Hx     Social History   Tobacco Use  . Smoking status: Former Smoker    Types: Cigars  . Smokeless tobacco: Never Used  . Tobacco comment: quit with +UPT  Substance Use Topics  . Alcohol use: No  . Drug use: No    Allergies: No Known Allergies  Medications Prior to Admission  Medication Sig Dispense Refill Last Dose  . acetaminophen (TYLENOL) 500 MG tablet Take 1,000 mg by mouth every 6 (six) hours as needed for moderate pain.     . diphenhydrAMINE (BENADRYL) 25 MG tablet Take 25 mg by mouth every 6 (six) hours as needed for allergies.     Marland Kitchen docusate sodium (COLACE) 100 MG capsule Take 1 capsule (100 mg total) by mouth 2 (two) times daily. 10 capsule 0   . Doxylamine-Pyridoxine (DICLEGIS) 10-10 MG TBEC Take 2 tablets at bedtime and one in the morning and one in the afternoon as needed for nausea. 60 tablet 2   . glycopyrrolate (ROBINUL) 2 MG  tablet Take 1 tablet (2 mg total) by mouth 3 (three) times daily as needed. 30 tablet 3   . polyethylene glycol (MIRALAX) 17 g packet Take 17 g by mouth daily. 14 each 1   . Prenat w/o A Vit-FeFum-FePo-FA (CONCEPT OB) 130-92.4-1 MG CAPS Take 1 tablet by mouth daily. 30 capsule 12   . promethazine (PHENERGAN) 25 MG tablet Take 1 tablet (25 mg total) by mouth every 6 (six) hours as needed for nausea or vomiting. 30 tablet 2     Review of Systems  Constitutional: Negative for fever.  Respiratory: Negative for cough and shortness of breath.   Gastrointestinal: Positive for nausea and vomiting. Negative for abdominal pain.  Genitourinary: Negative for dysuria, vaginal bleeding and vaginal discharge.   Physical Exam   Blood pressure 115/68, pulse 80, temperature 98.7 F (37.1 C), temperature source Oral, resp. rate 17, height 5\' 3"  (1.6 m), weight 73.5 kg, last  menstrual period 08/17/2018, SpO2 100 %, not currently breastfeeding.  Physical Exam  Nursing note and vitals reviewed. Constitutional: She is oriented to person, place, and time. She appears well-developed and well-nourished.  HENT:  Head: Normocephalic.  Eyes: EOM are normal.  Neck: Neck supple.  GI: Soft. There is no abdominal tenderness. There is no rebound and no guarding.  Musculoskeletal: Normal range of motion.  Neurological: She is alert and oriented to person, place, and time.  Skin: Skin is warm and dry.  Psychiatric: She has a normal mood and affect.    MAU Course  Procedures  MDM IVF 1000cc LR with Phenergan 25 mg started and will give 1000cc D5LR of MVI.  Fluids infused and client able to tolerate PO gingerale and crackers.  Assessment and Plan  Morning sickness Ptyalism Headache  Plan Keep your appointment tomorrow. Pick up the Diclegis and take tonight. Coninue to eat or drink every 2-3 hours.  Terri L Burleson 11/15/2018, 2:34 PM

## 2018-11-15 NOTE — MAU Provider Note (Signed)
History     CSN: [id number]  Arrival date and time: 11/15/18 1034   First Provider Initiated Contact with Patient 11/15/18 1119      Chief Complaint  Patient presents with  . Nausea  . Emesis  . Headache   HPI Samantha Knight 23 y.o. [redacted]w[redacted]d  Comes today as she is vomiting everything but water.  Has medicine for vomiting but it is not working.  Does take her phenergan but she is still vomiting and has lots of saliva.  Stopped using the medication for excessive saliva.  Has an appointment tomorrow for prenatal care.     OB History    Gravida  2   Para  1   Term  1   Preterm      AB      Living  1     SAB      TAB      Ectopic      Multiple  0   Live Births  1           Past Medical History:  Diagnosis Date  . Asthma    as a child.   . Migraine     Past Surgical History:  Procedure Laterality Date  . NO PAST SURGERIES      Family History  Problem Relation Age of Onset  . Cancer Neg Hx   . Diabetes Neg Hx   . Heart disease Neg Hx   . Stroke Neg Hx     Social History   Tobacco Use  . Smoking status: Former Smoker    Types: Cigars  . Smokeless tobacco: Never Used  . Tobacco comment: quit with +UPT  Substance Use Topics  . Alcohol use: No  . Drug use: No    Allergies: No Known Allergies  Medications Prior to Admission  Medication Sig Dispense Refill Last Dose  . acetaminophen (TYLENOL) 500 MG tablet Take 1,000 mg by mouth every 6 (six) hours as needed for moderate pain.     . diphenhydrAMINE (BENADRYL) 25 MG tablet Take 25 mg by mouth every 6 (six) hours as needed for allergies.     Marland Kitchen docusate sodium (COLACE) 100 MG capsule Take 1 capsule (100 mg total) by mouth 2 (two) times daily. 10 capsule 0   . glycopyrrolate (ROBINUL) 2 MG tablet Take 1 tablet (2 mg total) by mouth 3 (three) times daily as needed. 30 tablet 3   . polyethylene glycol (MIRALAX) 17 g packet Take 17 g by mouth daily. 14 each 1   . Prenat w/o A Vit-FeFum-FePo-FA  (CONCEPT OB) 130-92.4-1 MG CAPS Take 1 tablet by mouth daily. 30 capsule 12   . promethazine (PHENERGAN) 25 MG tablet Take 1 tablet (25 mg total) by mouth every 6 (six) hours as needed for nausea or vomiting. 30 tablet 2     Review of Systems  Constitutional: Negative for fever.  Respiratory: Negative for cough and shortness of breath.   Gastrointestinal: Positive for abdominal pain, nausea and vomiting.  Genitourinary: Negative for dysuria, vaginal bleeding and vaginal discharge.   Physical Exam   Blood pressure 122/67, pulse 87, temperature 99.2 F (37.3 C), temperature source Oral, resp. rate 16, height 5\' 3"  (1.6 m), weight 73.9 kg, last menstrual period 08/17/2018, SpO2 100 %, not currently breastfeeding.  Physical Exam  Nursing note and vitals reviewed. Constitutional: She is oriented to person, place, and time. She appears well-developed and well-nourished.  HENT:  Head: Normocephalic.  Eyes: EOM are  normal.  Neck: Neck supple.  GI: Soft. There is no abdominal tenderness. There is no rebound and no guarding.  Musculoskeletal: Normal range of motion.  Neurological: She is alert and oriented to person, place, and time.  Skin: Skin is warm and dry.  Psychiatric: She has a normal mood and affect.    MAU Course  Procedures Labs Results for orders placed or performed during the hospital encounter of 11/15/18 (from the past 24 hour(s))  Urinalysis, Routine w reflex microscopic     Status: Abnormal   Collection Time: 11/15/18 11:46 AM  Result Value Ref Range   Color, Urine YELLOW YELLOW   APPearance CLEAR CLEAR   Specific Gravity, Urine 1.026 1.005 - 1.030   pH 7.0 5.0 - 8.0   Glucose, UA NEGATIVE NEGATIVE mg/dL   Hgb urine dipstick NEGATIVE NEGATIVE   Bilirubin Urine NEGATIVE NEGATIVE   Ketones, ur 80 (A) NEGATIVE mg/dL   Protein, ur 30 (A) NEGATIVE mg/dL   Nitrite NEGATIVE NEGATIVE   Leukocytes,Ua NEGATIVE NEGATIVE   RBC / HPF 0-5 0 - 5 RBC/hpf   WBC, UA 0-5 0 - 5  WBC/hpf   Bacteria, UA RARE (A) NONE SEEN   Squamous Epithelial / LPF 0-5 0 - 5   Mucus PRESENT     MDM Client declines Phenergan and IVF today.  Needs to be able to drive home.  Discussed ptyalism and morning sickness - likely has both.  Prescribed Diclegis.  Discussed at length measures to handle vomiting.  Client had vomited after eating sausage biscuit yesterday and after having pizza.  Has not eaten yet today.  Stressed the importance of avoiding high fat foods and to try liquids at least every hour.  Assessment and Plan  Morning sickness - unable to stay for IVF  Plan See AVS for additional info given to client. Drink at least 8 8-oz glasses of water every day. BRAT diet - bananas, rice, applesauce, toast - no butter or oil Try soft foods and bland foods - popsicles, pudding, jello or fruit smoothie. Take your medicine - eat within one hour of taking the promethizine. Keep your appointment in the office tomorrow. Take Tylenol 325 mg 2 tablets by mouth every 4 hours if needed for pain.    L  11/15/2018, 11:27 AM

## 2018-11-15 NOTE — MAU Note (Signed)
Samantha Knight is a 23 y.o. at [redacted]w[redacted]d here in MAU reporting: states when she got home she took the phenergan tablet and threw it up immediately. Still has a headache.  Onset of complaint: 4 days  Pain score: 6/10  Vitals:   11/15/18 1407  BP: 115/68  Pulse: 80  Resp: 17  Temp: 98.7 F (37.1 C)  SpO2: 100%      Lab orders placed from triage: none

## 2018-11-15 NOTE — Discharge Instructions (Signed)
Drink at least 8 8-oz glasses of water every day. BRAT diet - bananas, rice, applesauce, toast - no butter or oil Try soft foods and bland foods - popsicles, pudding, jello or fruit smoothie. Take your medicine - eat within one hour of taking the promethizine. Keep your appointment in the office tomorrow. Take Tylenol 325 mg 2 tablets by mouth every 4 hours if needed for pain. If your pharmacy does not cover Diclegis, get over the counter Vitamin B6 and Unisom and take at bedtime.  Safe Medications in Pregnancy   Acne: Benzoyl Peroxide Salicylic Acid  Backache/Headache: Tylenol: 2 regular strength every 4 hours OR              2 Extra strength every 6 hours  Colds/Coughs/Allergies: Benadryl (alcohol free) 25 mg every 6 hours as needed Breath right strips Claritin Cepacol throat lozenges Chloraseptic throat spray Cold-Eeze- up to three times per day Cough drops, alcohol free Flonase Guaifenesin Mucinex Robitussin DM (plain only, alcohol free) Saline nasal spray/drops Sudafed (pseudoephedrine) & Actifed ** use only after [redacted] weeks gestation and if you do not have high blood pressure Tylenol Vicks Vaporub Zinc lozenges Zyrtec   Constipation: Colace Ducolax suppositories Fleet enema Glycerin suppositories Metamucil Milk of magnesia Miralax Senokot Smooth move tea  Diarrhea: Kaopectate Imodium A-D  *NO pepto Bismol  Hemorrhoids: Anusol Anusol HC Preparation H Tucks  Indigestion: Tums Maalox Mylanta Zantac  Pepcid  Insomnia: Benadryl (alcohol free) 25mg  every 6 hours as needed Tylenol PM Unisom, no Gelcaps  Leg Cramps: Tums MagGel  Nausea/Vomiting:  Bonine Dramamine Emetrol Ginger extract Sea bands Meclizine  Nausea medication to take during pregnancy:  Unisom (doxylamine succinate 25 mg tablets) Take one tablet daily at bedtime. If symptoms are not adequately controlled, the dose can be increased to a maximum recommended dose of two  tablets daily (1/2 tablet in the morning, 1/2 tablet mid-afternoon and one at bedtime). Vitamin B6 100mg  tablets. Take one tablet twice a day (up to 200 mg per day).  Skin Rashes: Aveeno products Benadryl cream or 25mg  every 6 hours as needed Calamine Lotion 1% cortisone cream  Yeast infection: Gyne-lotrimin 7 Monistat 7   **If taking multiple medications, please check labels to avoid duplicating the same active ingredients **take medication as directed on the label ** Do not exceed 4000 mg of tylenol in 24 hours **Do not take medications that contain aspirin or ibuprofen

## 2018-11-16 ENCOUNTER — Encounter: Payer: Managed Care, Other (non HMO) | Admitting: Obstetrics & Gynecology

## 2018-11-17 ENCOUNTER — Other Ambulatory Visit: Payer: Self-pay

## 2018-11-17 ENCOUNTER — Ambulatory Visit (INDEPENDENT_AMBULATORY_CARE_PROVIDER_SITE_OTHER): Payer: Managed Care, Other (non HMO) | Admitting: Obstetrics & Gynecology

## 2018-11-17 VITALS — BP 126/79 | HR 90 | Wt 166.6 lb

## 2018-11-17 DIAGNOSIS — Z3480 Encounter for supervision of other normal pregnancy, unspecified trimester: Secondary | ICD-10-CM

## 2018-11-17 DIAGNOSIS — Z3481 Encounter for supervision of other normal pregnancy, first trimester: Secondary | ICD-10-CM

## 2018-11-17 DIAGNOSIS — Z3A11 11 weeks gestation of pregnancy: Secondary | ICD-10-CM

## 2018-11-17 NOTE — Progress Notes (Signed)
Pt. Wants to discuss diet information today and states she does skip 5 or more meals a week.   Pt. Declined flu shot 11/17/2018  Pt. Is interested in baby scripts - does not have BP monitor at home   Pt. Wants to discuss genetic testing

## 2018-11-17 NOTE — Progress Notes (Signed)
  Subjective:    Samantha Knight is being seen today for her first obstetrical visit.  This is a planned pregnancy. She is at [redacted]w[redacted]d gestation. Her obstetrical history is significant for none. Relationship with FOB: spouse, living together. Patient unsure about feeding. Pregnancy history fully reviewed.  Patient reports nausea improving.  Review of Systems:   Review of Systems  homemaker  Objective:     BP 126/79   Pulse 90   Wt 166 lb 9.6 oz (75.6 kg)   LMP 08/17/2018   BMI 29.51 kg/m  Physical Exam  Exam    Assessment:    Pregnancy: G2P1001 Patient Active Problem List   Diagnosis Date Noted  . Supervision of other normal pregnancy, antepartum 10/18/2018  . Vaginal pain 10/21/2017  . Skin lesion 10/21/2017  . BMI 30.0-30.9,adult 01/10/2017       Plan:     Initial labs drawn. Prenatal vitamins. Babyscripts Will download Mychart app She declines NIPS/[facility]  Anatomy u/s ordered  Emily Filbert 11/17/2018

## 2018-11-18 LAB — OBSTETRIC PANEL, INCLUDING HIV
Antibody Screen: NEGATIVE
Basophils Absolute: 0 10*3/uL (ref 0.0–0.2)
Basos: 0 %
EOS (ABSOLUTE): 0.1 10*3/uL (ref 0.0–0.4)
Eos: 1 %
HIV Screen 4th Generation wRfx: NONREACTIVE
Hematocrit: 30 % — ABNORMAL LOW (ref 34.0–46.6)
Hemoglobin: 10.3 g/dL — ABNORMAL LOW (ref 11.1–15.9)
Hepatitis B Surface Ag: NEGATIVE
Immature Grans (Abs): 0 10*3/uL (ref 0.0–0.1)
Immature Granulocytes: 0 %
Lymphocytes Absolute: 1.4 10*3/uL (ref 0.7–3.1)
Lymphs: 22 %
MCH: 30.7 pg (ref 26.6–33.0)
MCHC: 34.3 g/dL (ref 31.5–35.7)
MCV: 89 fL (ref 79–97)
Monocytes Absolute: 0.4 10*3/uL (ref 0.1–0.9)
Monocytes: 7 %
Neutrophils Absolute: 4.6 10*3/uL (ref 1.4–7.0)
Neutrophils: 70 %
Platelets: 208 10*3/uL (ref 150–450)
RBC: 3.36 x10E6/uL — ABNORMAL LOW (ref 3.77–5.28)
RDW: 13 % (ref 11.7–15.4)
RPR Ser Ql: NONREACTIVE
Rh Factor: POSITIVE
Rubella Antibodies, IGG: 3.05 index (ref >0.99)
WBC: 6.6 10*3/uL (ref 3.4–10.8)

## 2018-11-18 LAB — HEMOGLOBIN A1C
Est. average glucose Bld gHb Est-mCnc: 94 mg/dL
Hgb A1c MFr Bld: 4.9 % (ref 4.8–5.6)

## 2018-11-19 ENCOUNTER — Encounter: Payer: Self-pay | Admitting: Obstetrics & Gynecology

## 2018-11-19 DIAGNOSIS — O99019 Anemia complicating pregnancy, unspecified trimester: Secondary | ICD-10-CM | POA: Insufficient documentation

## 2018-11-19 DIAGNOSIS — O99013 Anemia complicating pregnancy, third trimester: Secondary | ICD-10-CM | POA: Insufficient documentation

## 2018-11-19 DIAGNOSIS — D509 Iron deficiency anemia, unspecified: Secondary | ICD-10-CM | POA: Insufficient documentation

## 2018-11-19 LAB — URINE CULTURE, OB REFLEX

## 2018-11-19 LAB — CULTURE, OB URINE

## 2018-12-15 ENCOUNTER — Other Ambulatory Visit: Payer: Self-pay

## 2018-12-15 ENCOUNTER — Telehealth (INDEPENDENT_AMBULATORY_CARE_PROVIDER_SITE_OTHER): Payer: Managed Care, Other (non HMO) | Admitting: Obstetrics & Gynecology

## 2018-12-15 DIAGNOSIS — Z3480 Encounter for supervision of other normal pregnancy, unspecified trimester: Secondary | ICD-10-CM

## 2018-12-15 DIAGNOSIS — O99012 Anemia complicating pregnancy, second trimester: Secondary | ICD-10-CM

## 2018-12-15 DIAGNOSIS — Z3A15 15 weeks gestation of pregnancy: Secondary | ICD-10-CM

## 2018-12-15 MED ORDER — DOCUSATE SODIUM 100 MG PO CAPS: 100.0000 mg | ORAL_CAPSULE | Freq: Two times a day (BID) | ORAL | 2 refills | Status: DC

## 2018-12-15 NOTE — Progress Notes (Signed)
I connected with  Samantha Knight on 12/15/18 at  2:15 PM EST by telephone and verified that I am speaking with the correct person using two identifiers.   I discussed the limitations, risks, security and privacy concerns of performing an evaluation and management service by telephone and the availability of in person appointments. I also discussed with the patient that there may be a patient responsible charge related to this service. The patient expressed understanding and agreed to proceed.  Crosby Oyster, RN 12/15/2018  2:18 PM

## 2018-12-15 NOTE — Patient Instructions (Signed)

## 2018-12-15 NOTE — Progress Notes (Signed)
   TELEHEALTH OBSTETRICS PRENATAL VIRTUAL VIDEO VISIT ENCOUNTER NOTE  Provider location: [facility] for [place] [facility]   I connected with Samantha Knight on 12/15/18 at  2:15 PM EST by MyChart Video Encounter at home and verified that I am speaking with the correct person using two identifiers.   I discussed the limitations, risks, security and privacy concerns of performing an evaluation and management service virtually and the availability of in person appointments. I also discussed with the patient that there may be a patient responsible charge related to this service. The patient expressed understanding and agreed to proceed. Subjective:  Samantha Knight is a 23 y.o. G2P1001 at [redacted]w[redacted]d being seen today for ongoing prenatal care.  She is currently monitored for the following issues for this low-risk pregnancy and has BMI 30.0-30.9,adult; Skin lesion; Supervision of other normal pregnancy, antepartum; and Anemia in pregnancy on their problem list.  Patient reports no complaints.  Contractions: Not present. Vag. Bleeding: None.   . Denies any leaking of fluid.   The following portions of the patient's history were reviewed and updated as appropriate: allergies, current medications, past family history, past medical history, past social history, past surgical history and problem list.   Objective:  There were no vitals filed for this visit.  Fetal Status:           General:  Alert, oriented and cooperative. Patient is in no acute distress.  Respiratory: Normal respiratory effort, no problems with respiration noted  Mental Status: Normal mood and affect. Normal behavior. Normal judgment and thought content.  Rest of physical exam deferred due to type of encounter  Imaging: No results found.  Assessment and Plan:  Pregnancy: G2P1001 at [redacted]w[redacted]d 1. Anemia during pregnancy in second trimester Taking iron supplements, needs refill of Colace for constipation. This was refilled  for patient. - docusate sodium (COLACE) 100 MG capsule; Take 1 capsule (100 mg total) by mouth 2 (two) times daily.  Dispense: 30 capsule; Refill: 2  2. Supervision of other normal pregnancy, antepartum Anatomy scan already scheduled. Declines genetic/carrier testing. No other complaints or concerns.  Routine obstetric precautions reviewed.  I discussed the assessment and treatment plan with the patient. The patient was provided an opportunity to ask questions and all were answered. The patient agreed with the plan and demonstrated an understanding of the instructions. The patient was advised to call back or seek an in-person office evaluation/go to MAU at [facility] for any urgent or concerning symptoms. Please refer to After Visit Summary for other counseling recommendations.   I provided 10 minutes of face-to-face time during this encounter.  Return in about 4 weeks (around 01/12/2019) for Virtual OB Visit.  Future Appointments  Date Time Provider [place]  01/05/2019  1:30 PM WH-MFC [place] 5 WH-MFCUS MFC-US    Verita Schneiders, [place] for [facility], [place]

## 2019-01-05 ENCOUNTER — Other Ambulatory Visit (HOSPITAL_COMMUNITY): Payer: Self-pay | Admitting: *Deleted

## 2019-01-05 ENCOUNTER — Encounter (HOSPITAL_COMMUNITY): Payer: Self-pay

## 2019-01-05 ENCOUNTER — Telehealth: Payer: Self-pay

## 2019-01-05 ENCOUNTER — Other Ambulatory Visit: Payer: Self-pay

## 2019-01-05 ENCOUNTER — Ambulatory Visit (HOSPITAL_COMMUNITY)
Admission: RE | Admit: 2019-01-05 | Discharge: 2019-01-05 | Disposition: A | Discharge status: Home / Self Care | Payer: Managed Care, Other (non HMO) | Source: Ambulatory Visit | Attending: Obstetrics and Gynecology | Admitting: Obstetrics and Gynecology

## 2019-01-05 DIAGNOSIS — Z3480 Encounter for supervision of other normal pregnancy, unspecified trimester: Secondary | ICD-10-CM | POA: Diagnosis not present

## 2019-01-05 DIAGNOSIS — O99012 Anemia complicating pregnancy, second trimester: Secondary | ICD-10-CM | POA: Diagnosis not present

## 2019-01-05 DIAGNOSIS — Z363 Encounter for antenatal screening for malformations: Secondary | ICD-10-CM | POA: Diagnosis not present

## 2019-01-05 DIAGNOSIS — Z3A18 18 weeks gestation of pregnancy: Secondary | ICD-10-CM

## 2019-01-05 DIAGNOSIS — Z362 Encounter for other antenatal screening follow-up: Secondary | ICD-10-CM

## 2019-01-05 NOTE — Telephone Encounter (Signed)
Received called from patient reporting some lower abdominal pain that started abput three days ago. She reports not been to walk today. I have advised her to try taking some tylenol to see if she gets some relief. It the pain gets severe she should follow up at the [facility] and children unit to be seen. Patient voice understanding at this time.

## 2019-01-12 ENCOUNTER — Telehealth (INDEPENDENT_AMBULATORY_CARE_PROVIDER_SITE_OTHER): Payer: Managed Care, Other (non HMO) | Admitting: Obstetrics & Gynecology

## 2019-01-12 ENCOUNTER — Other Ambulatory Visit: Payer: Self-pay

## 2019-01-12 DIAGNOSIS — O99012 Anemia complicating pregnancy, second trimester: Secondary | ICD-10-CM

## 2019-01-12 DIAGNOSIS — Z6830 Body mass index (BMI) 30.0-30.9, adult: Secondary | ICD-10-CM

## 2019-01-12 DIAGNOSIS — Z3480 Encounter for supervision of other normal pregnancy, unspecified trimester: Secondary | ICD-10-CM

## 2019-01-12 DIAGNOSIS — Z3A19 19 weeks gestation of pregnancy: Secondary | ICD-10-CM

## 2019-01-12 NOTE — Progress Notes (Signed)
I connected with  Juanetta Snow on 01/12/19 at  1:15 PM EST by telephone and verified that I am speaking with the correct person using two identifiers.   I discussed the limitations, risks, security and privacy concerns of performing an evaluation and management service by telephone and the availability of in person appointments. I also discussed with the patient that there may be a patient responsible charge related to this service. The patient expressed understanding and agreed to proceed.  Crosby Oyster, RN 01/12/2019  1:21 PM

## 2019-01-12 NOTE — Progress Notes (Addendum)
TELEHEALTH OBSTETRICS PRENATAL VIRTUAL VIDEO VISIT ENCOUNTER NOTE  Provider location: [facility] for [facility] at [facility]   I connected with Samantha Knight on 01/12/19 at  1:15 PM EST by MyChart Video Encounter at home and verified that I am speaking with the correct person using two identifiers.   I discussed the limitations, risks, security and privacy concerns of performing an evaluation and management service virtually and the availability of in person appointments. I also discussed with the patient that there may be a patient responsible charge related to this service. The patient expressed understanding and agreed to proceed. Subjective:  Samantha Knight is a 23 y.o. G2P1001 at [redacted]w[redacted]d being seen today for ongoing prenatal care.  She is currently monitored for the following issues for this low-risk pregnancy and has BMI 30.0-30.9,adult; Skin lesion; Supervision of other normal pregnancy, antepartum; and Anemia in pregnancy on their problem list.  Patient reports no complaints except for some hip pain. I showed her some exercises and told her that she can see a chiropractor also. Contractions: Not present. Vag. Bleeding: None.  Movement: Present. Denies any leaking of fluid.   The following portions of the patient's history were reviewed and updated as appropriate: allergies, current medications, past family history, past medical history, past social history, past surgical history and problem list.   Objective:  There were no vitals filed for this visit.  Fetal Status:     Movement: Present     General:  Alert, oriented and cooperative. Patient is in no acute distress.  Respiratory: Normal respiratory effort, no problems with respiration noted  Mental Status: Normal mood and affect. Normal behavior. Normal judgment and thought content.  Rest of physical exam deferred due to type of encounter  Imaging: [place] MFM OB COMP + 14 WK  Result Date: 01/05/2019  ----------------------------------------------------------------------  OBSTETRICS REPORT                       (Signed Final 01/05/2019 03:29 pm) ---------------------------------------------------------------------- Patient Info  ID #:       [id number]                          D.O.B.:  11-19-1995 (23 yrs)  Name:       Samantha Knight               Visit Date: 01/05/2019 01:34 pm ---------------------------------------------------------------------- Performed By  Performed By:     Tomma Lightning             Ref. Address:     [address]                                                             [place], [place]  [place]  Attending:        Sander Nephew      [facility]                                       [facility]  Referred By:      Emily Filbert MD ---------------------------------------------------------------------- Orders   #  Description                          Code         Ordered By   1  [place] MFM OB COMP + 103 WK               76805.01     Yosef Krogh  ----------------------------------------------------------------------   #  Order #                    Accession #                 Episode #   1  [id number]                  [id number]                  [id number]  ---------------------------------------------------------------------- Indications   Encounter for antenatal screening for          Z36.3   malformations (declines testing)   [redacted] weeks gestation of pregnancy                Z3A.18   Anemia during pregnancy in second trimester    O99.012  ---------------------------------------------------------------------- Vital Signs                                                 Height:        5'3" ---------------------------------------------------------------------- Fetal Evaluation  Num Of Fetuses:         1  Fetal  Heart Rate(bpm):  154  Cardiac Activity:       Observed  Presentation:           Breech  Placenta:               Posterior  P. Cord Insertion:      Visualized  Amniotic Fluid  AFI FV:      Within normal limits                              Largest Pocket(cm)                              5.7 ---------------------------------------------------------------------- Biometry  BPD:        43  mm     G. Age:  19w 0d         85  %    CI:        77.25   %    70 - 86  FL/HC:      18.0   %    15.8 - 18  HC:      154.9  mm     G. Age:  18w 3d         57  %    HC/AC:      1.08        1.07 - 1.29  AC:      143.5  mm     G. Age:  19w 5d         90  %    FL/BPD:     64.9   %  FL:       27.9  mm     G. Age:  18w 4d         59  %    FL/AC:      19.4   %    20 - 24  HUM:      29.5  mm     G. Age:  19w 5d       > 95  %  CER:        18  mm     G. Age:  18w 0d         44  %  NFT:       3.3  mm  LV:        6.3  mm  CM:        4.3  mm  Est. FW:     273  gm    0 lb 10 oz      93  % ---------------------------------------------------------------------- OB History  Gravidity:    2         Term:   1  Living:       1 ---------------------------------------------------------------------- Gestational Age  LMP:           20w 1d        Date:  08/17/18                 EDD:   05/24/19  U/S Today:     19w 0d                                        EDD:   06/01/19  Best:          18w 1d     Det. By:  Marcella Dubs         EDD:   06/07/19                                      (10/18/18) ---------------------------------------------------------------------- Anatomy  Cranium:               Appears normal         LVOT:                   Appears normal  Cavum:                 Appears normal         Aortic Arch:            Appears normal  Ventricles:            Appears normal         Ductal Arch:  Appears normal  Choroid Plexus:        Appears normal         Diaphragm:              Appears  normal  Cerebellum:            Appears normal         Stomach:                Appears normal, left                                                                        sided  Posterior Fossa:       Appears normal         Abdomen:                Appears normal  Nuchal Fold:           Appears normal         Abdominal Wall:         Appears nml (cord                                                                        insert, abd wall)  Face:                  Orbits nl; profile not Cord Vessels:           Appears normal (3                         well visualized                                vessel cord)  Lips:                  Appears normal         Kidneys:                Appear normal  Palate:                Not well visualized    Bladder:                Appears normal  Thoracic:              Appears normal         Spine:                  Appears normal  Heart:                 Appears normal         Upper Extremities:      Appears normal                         (4CH, axis, and  situs)  RVOT:                  Appears normal         Lower Extremities:      Appears normal  Other:  Heels and 5th digit visualized. Parents do not wish to know sex of          fetus. Open hands visualized. Nasal bone visualized. Technically          difficult due to fetal position. ---------------------------------------------------------------------- Cervix Uterus Adnexa  Cervix  Length:           3.93  cm.  Normal appearance by transabdominal scan.  Uterus  No abnormality visualized.  Left Ovary  Size(cm)       3.3  x   2.4    x  2.4       Vol(ml): 9.95  Within normal limits.  Right Ovary  Size(cm)       3.3  x    2     x  1.8       Vol(ml): 6.22  Within normal limits. ---------------------------------------------------------------------- Impression  Normal growth and anatomy  Suboptimal views of the fetal anatomy obtained today  secondary to fetal position  ---------------------------------------------------------------------- Recommendations  Repeat exam in 4 weeks to complete fetal anatomy. ----------------------------------------------------------------------               Lin Landsmanorenthian Booker, MD Electronically Signed Final Report   01/05/2019 03:29 pm ----------------------------------------------------------------------   Assessment and Plan:  Pregnancy: G2P1001 at 6358w1d 1. Anemia during pregnancy in second trimester   2. Supervision of other normal pregnancy, antepartum   3. BMI 30.0-30.9,adult - early HBA1C 4.9  Preterm labor symptoms and general obstetric precautions including but not limited to vaginal bleeding, contractions, leaking of fluid and fetal movement were reviewed in detail with the patient. I discussed the assessment and treatment plan with the patient. The patient was provided an opportunity to ask questions and all were answered. The patient agreed with the plan and demonstrated an understanding of the instructions. The patient was advised to call back or seek an in-person office evaluation/[facility] for any urgent or concerning symptoms. Please refer to After Visit Summary for other counseling recommendations.   I provided 10 minutes of face-to-face time during this encounter.  No follow-ups on file.  Future Appointments  Date Time Provider Department Center  02/02/2019  2:30 PM WH-MFC US 5 WH-MFCUS MFC-US  02/08/2019  2:15 PM Anyanwu, Jethro BastosUgonna A, MD [facility]    Allie BossierMyra C Recardo Linn, MD [facility], [facility]

## 2019-01-15 NOTE — L&D Delivery Note (Signed)
OB/GYN Faculty Practice Delivery Note  Samantha Knight is a 24 y.o. G2P1001 s/p SVD at [redacted]w[redacted]d. She was admitted for active labor.   ROM: 0h 44m with clear fluid GBS Status: neg Maximum Maternal Temperature: 99.5  Labor Progress: Marland Kitchen [name] arrived onto L&D at 5-6cm and spontaneously progressed to SVD after receiving and epidural and having SROM.  Delivery Date/Time: May 15th, 2021 at 0341 Delivery: Called to room and patient was complete and pushing. Head delivered ROA. No nuchal cord present. Shoulder and body delivered in usual fashion. Infant with spontaneous cry, placed on mother's abdomen, dried and stimulated. Cord clamped x 2 after 1-minute delay, and cut by FOB. Cord blood drawn. Placenta delivered spontaneously with gentle cord traction. Fundus firm with massage and Pitocin. Labia, perineum, vagina, and cervix inspected and found to be intact.   Placenta: spont, intact Complications: none Lacerations: none EBL: 150cc Analgesia: epidural  Postpartum Planning [x]  message to sent to schedule follow-up    Infant: female  APGARs 9/9  2730g (6lb 0.3oz)  08-06-1974, CNM  05/29/2019 4:03 AM

## 2019-02-02 ENCOUNTER — Other Ambulatory Visit: Payer: Self-pay

## 2019-02-02 ENCOUNTER — Ambulatory Visit (HOSPITAL_COMMUNITY)
Admission: RE | Admit: 2019-02-02 | Discharge: 2019-02-02 | Disposition: A | Discharge status: Home / Self Care | Payer: Managed Care, Other (non HMO) | Source: Ambulatory Visit | Attending: Obstetrics and Gynecology | Admitting: Obstetrics and Gynecology

## 2019-02-02 ENCOUNTER — Encounter (HOSPITAL_COMMUNITY): Payer: Self-pay

## 2019-02-02 DIAGNOSIS — Z362 Encounter for other antenatal screening follow-up: Secondary | ICD-10-CM | POA: Diagnosis present

## 2019-02-02 DIAGNOSIS — Z3A27 27 weeks gestation of pregnancy: Secondary | ICD-10-CM | POA: Diagnosis not present

## 2019-02-02 DIAGNOSIS — O09292 Supervision of pregnancy with other poor reproductive or obstetric history, second trimester: Secondary | ICD-10-CM

## 2019-02-08 ENCOUNTER — Telehealth: Payer: Managed Care, Other (non HMO) | Admitting: Obstetrics & Gynecology

## 2019-02-08 ENCOUNTER — Other Ambulatory Visit: Payer: Self-pay

## 2019-02-08 ENCOUNTER — Telehealth (INDEPENDENT_AMBULATORY_CARE_PROVIDER_SITE_OTHER): Payer: Managed Care, Other (non HMO) | Admitting: Obstetrics & Gynecology

## 2019-02-08 DIAGNOSIS — Z3482 Encounter for supervision of other normal pregnancy, second trimester: Secondary | ICD-10-CM

## 2019-02-08 DIAGNOSIS — Z3A23 23 weeks gestation of pregnancy: Secondary | ICD-10-CM

## 2019-02-08 DIAGNOSIS — Z3480 Encounter for supervision of other normal pregnancy, unspecified trimester: Secondary | ICD-10-CM

## 2019-02-08 MED ORDER — VITAFOL GUMMIES 3.33-0.333-34.8 MG PO CHEW: 1.0000 | CHEWABLE_TABLET | Freq: Every day | ORAL | 5 refills | Status: DC

## 2019-02-08 NOTE — Progress Notes (Signed)
TELEHEALTH OBSTETRICS PRENATAL VIRTUAL VIDEO VISIT ENCOUNTER NOTE  Provider location: Center for [facility]   I connected with Samantha Knight on 02/08/19 at  2:15 PM EST by MyChart Video Encounter at home and verified that I am speaking with the correct person using two identifiers.   I discussed the limitations, risks, security and privacy concerns of performing an evaluation and management service virtually and the availability of in person appointments. I also discussed with the patient that there may be a patient responsible charge related to this service. The patient expressed understanding and agreed to proceed. Subjective:  Samantha Knight is a 24 y.o. G2P1001 at [redacted]w[redacted]d being seen today for ongoing prenatal care.  She is currently monitored for the following issues for this low-risk pregnancy and has BMI 30.0-30.9,adult; Skin lesion; Supervision of other normal pregnancy, antepartum; and Anemia in pregnancy on their problem list.  Patient reports URI symptoms, no fevers. Being tested for COVID tomorrow. Isolating with son at her house.  Contractions: Not present. Vag. Bleeding: None.  Movement: Present. Denies any leaking of fluid.   The following portions of the patient's history were reviewed and updated as appropriate: allergies, current medications, past family history, past medical history, past social history, past surgical history and problem list.   Objective:  There were no vitals filed for this visit.  Fetal Status:     Movement: Present     General:  Alert, oriented and cooperative. Patient is in no acute distress.  Respiratory: Normal respiratory effort, no problems with respiration noted  Mental Status: Normal mood and affect. Normal behavior. Normal judgment and thought content.  Rest of physical exam deferred due to type of encounter  Imaging: [place] MFM OB FOLLOW UP  Result Date:  02/02/2019 ----------------------------------------------------------------------  OBSTETRICS REPORT                       (Signed Final 02/02/2019 05:10 pm) ---------------------------------------------------------------------- Patient Info  ID #:       [id number]                          D.O.B.:  October 14, 1995 (23 yrs)  Name:       Samantha Knight               Visit Date: 02/02/2019 02:45 pm ---------------------------------------------------------------------- Performed By  Performed By:     Lenise Arena        Ref. Address:     [address]                                                             [place], [place]  [id number]  Attending:        Ma RingsVictor Fang MD         [facility]  Referred By:      Allie BossierMYRA C DOVE MD ---------------------------------------------------------------------- Orders   #  Description                          Code         Ordered By   1  US MFM OB FOLLOW UP                  [id number]     Lin LandsmanORENTHIAN                                                        BOOKER  ----------------------------------------------------------------------   #  Order #                    Accession #                 Episode #   1  [id number]                  [id number]                  [id number]  ---------------------------------------------------------------------- Indications   Encounter for other antenatal screening        Z36.2   follow-up (declines testing)   Anemia during pregnancy in second trimester    O99.012   [redacted] weeks gestation of pregnancy                Z3A.22  ---------------------------------------------------------------------- Vital Signs  Weight (lb): 166                               Height:        5'3"  BMI:         29.4  ---------------------------------------------------------------------- Fetal Evaluation  Num Of Fetuses:         1  Fetal Heart Rate(bpm):  145  Cardiac Activity:       Observed  Presentation:           Transverse, head to maternal right  Placenta:               Posterior  P. Cord Insertion:      Previously Visualized  Amniotic Fluid  AFI FV:      Within normal limits                              Largest Pocket(cm)                              5.63 ---------------------------------------------------------------------- Biometry  BPD:  54.7  mm     G. Age:  22w 5d         67  %    CI:        74.41   %    70 - 86                                                          FL/HC:      20.0   %    18.4 - 20.2  HC:      201.3  mm     G. Age:  22w 2d         43  %    HC/AC:      1.05        1.06 - 1.25  AC:      190.9  mm     G. Age:  23w 6d         89  %    FL/BPD:     73.5   %    71 - 87  FL:       40.2  mm     G. Age:  23w 0d         68  %    FL/AC:      21.1   %    20 - 24  LV:        4.4  mm  Est. FW:     582  gm      1 lb 5 oz     93  % ---------------------------------------------------------------------- OB History  Gravidity:    2         Term:   1  Living:       1 ---------------------------------------------------------------------- Gestational Age  LMP:           24w 1d        Date:  08/17/18                 EDD:   05/24/19  U/S Today:     23w 0d                                        EDD:   06/01/19  Best:          22w 1d     Det. By:  Marcella Dubs         EDD:   06/07/19                                      (10/18/18) ---------------------------------------------------------------------- Anatomy  Cranium:               Appears normal         Aortic Arch:            Previously seen  Cavum:                 Appears normal         Ductal Arch:            Previously seen  Ventricles:  Appears normal         Diaphragm:              Appears normal  Choroid Plexus:        Previously seen         Stomach:                Appears normal, left                                                                        sided  Cerebellum:            Previously seen        Abdomen:                Appears normal  Posterior Fossa:       Previously seen        Abdominal Wall:         Previously seen  Nuchal Fold:           Previously seen        Cord Vessels:           Previously seen  Face:                  Profile nl; orbits     Kidneys:                Appear normal                         previously seen  Lips:                  Appears normal         Bladder:                Appears normal  Thoracic:              Appears normal         Spine:                  Previously seen  Heart:                 Appears normal         Upper Extremities:      Previously seen                         (4CH, axis, and                         situs)  RVOT:                  Appears normal         Lower Extremities:      Previously seen  LVOT:                  Appears normal  Other:  Heels and 5th digit previously visualized. ---------------------------------------------------------------------- Cervix Uterus Adnexa  Cervix  Length:           4.22  cm.  Normal appearance by transabdominal scan. ---------------------------------------------------------------------- Comments  This patient was seen for a follow up  exam as the fetal  cardiac views were unable to be fully visualized during her  prior exam.  She denies any problems since her last exam.  The fetal growth and amniotic fluid level appears appropriate  for her gestational age.  The views of the fetal heart were visualized today.  There  were no obvious anomalies suspected.  The limitations of  ultrasound in the detection of all anomalies was discussed.  Follow-up as indicated. ----------------------------------------------------------------------                   Johnell Comings, MD Electronically Signed Final Report   02/02/2019 05:10 pm  ----------------------------------------------------------------------   Assessment and Plan:  Pregnancy: G2P1001 at [redacted]w[redacted]d 1. Supervision of other normal pregnancy, antepartum - Prenatal Vit-Fe Phos-FA-Omega (VITAFOL GUMMIES) 3.33-0.333-34.8 MG CHEW; Chew 1 tablet by mouth daily.  Dispense: 90 tablet; Refill: 5 Will follow up COVID results. Cautioned that pregnancy can lead to severe symptoms of COVID, precautions reviewed in detail.   Preterm labor symptoms and general obstetric precautions including but not limited to vaginal bleeding, contractions, leaking of fluid and fetal movement were reviewed in detail with the patient. I discussed the assessment and treatment plan with the patient. The patient was provided an opportunity to ask questions and all were answered. The patient agreed with the plan and demonstrated an understanding of the instructions. The patient was advised to call back or seek an in-person office evaluation/go to MAU at [facility] for any urgent or concerning symptoms. Please refer to After Visit Summary for other counseling recommendations.   I provided 10 minutes of face-to-face time during this encounter.  Return in about 4 weeks (around 03/08/2019) for 2 hr GTT, 3rd trimester labs, TDap, OFFICE OB Visit.  Future Appointments  Date Time Provider [place]  03/09/2019  8:15 AM Aletha Halim, MD [facility]    Verita Schneiders, MD Center for [facility], [place]

## 2019-02-08 NOTE — Patient Instructions (Signed)
Return to office for any scheduled appointments. Call the office or go to the MAU at [facility] if:  You begin to have strong, frequent contractions  Your water breaks.  Sometimes it is a big gush of fluid, sometimes it is just a trickle that keeps getting your panties wet or running down your legs  You have vaginal bleeding.  It is normal to have a small amount of spotting if your cervix was checked.   You do not feel your baby moving like normal.  If you do not, get something to eat and drink and lay down and focus on feeling your baby move.   If your baby is still not moving like normal, you should call the office or go to MAU.  Any other obstetric concerns.  TDaP Vaccine Pregnancy Get the Whooping Cough Vaccine While You Are Pregnant (CDC)  It is important for women to get the whooping cough vaccine in the third trimester of each pregnancy. Vaccines are the best way to prevent this disease. There are 2 different whooping cough vaccines. Both vaccines combine protection against whooping cough, tetanus and diphtheria, but they are for different age groups: Tdap: for everyone 11 years or older, including pregnant women  DTaP: for children 2 months through 6 years of age  You need the whooping cough vaccine during each of your pregnancies The recommended time to get the shot is during your 27th through 36th week of pregnancy, preferably during the earlier part of this time period. The Centers for Disease Control and Prevention (CDC) recommends that pregnant women receive the whooping cough vaccine for adolescents and adults (called Tdap vaccine) during the third trimester of each pregnancy. The recommended time to get the shot is during your 27th through 36th week of pregnancy, preferably during the earlier part of this time period. This replaces the original recommendation that pregnant women get the vaccine only if they had not previously received it. The  American College of Obstetricians and Gynecologists and the American College of Nurse-Midwives support this recommendation.  You should get the whooping cough vaccine while pregnant to pass protection to your baby frame support disabled and/or not supported in this browser  Learn why Laura decided to get the whooping cough vaccine in her 3rd trimester of pregnancy and how her baby girl was born with some protection against the disease. Also available on YouTube. After receiving the whooping cough vaccine, your body will create protective antibodies (proteins produced by the body to fight off diseases) and pass some of them to your baby before birth. These antibodies provide your baby some short-term protection against whooping cough in early life. These antibodies can also protect your baby from some of the more serious complications that come along with whooping cough. Your protective antibodies are at their highest about 2 weeks after getting the vaccine, but it takes time to pass them to your baby. So the preferred time to get the whooping cough vaccine is early in your third trimester. The amount of whooping cough antibodies in your body decreases over time. That is why CDC recommends you get a whooping cough vaccine during each pregnancy. Doing so allows each of your babies to get the greatest number of protective antibodies from you. This means each of your babies will get the best protection possible against this disease.  Getting the whooping cough vaccine while pregnant is better than getting the vaccine after you give birth Whooping cough vaccination during   pregnancy is ideal so your baby will have short-term protection as soon as he is born. This early protection is important because your baby will not start getting his whooping cough vaccines until he is 2 months old. These first few months of life are when your baby is at greatest risk for catching whooping cough. This is also when he's at  greatest risk for having severe, potentially life-threating complications from the infection. To avoid that gap in protection, it is best to get a whooping cough vaccine during pregnancy. You will then pass protection to your baby before he is born. To continue protecting your baby, he should get whooping cough vaccines starting at 2 months old. You may never have gotten the Tdap vaccine before and did not get it during this pregnancy. If so, you should make sure to get the vaccine immediately after you give birth, before leaving the hospital or birthing center. It will take about 2 weeks before your body develops protection (antibodies) in response to the vaccine. Once you have protection from the vaccine, you are less likely to give whooping cough to your newborn while caring for him. But remember, your baby will still be at risk for catching whooping cough from others. A recent study looked to see how effective Tdap was at preventing whooping cough in babies whose mothers got the vaccine while pregnant or in the hospital after giving birth. The study found that getting Tdap between 27 through 36 weeks of pregnancy is 85% more effective at preventing whooping cough in babies younger than 2 months old. Blood tests cannot tell if you need a whooping cough vaccine There are no blood tests that can tell you if you have enough antibodies in your body to protect yourself or your baby against whooping cough. Even if you have been sick with whooping cough in the past or previously received the vaccine, you still should get the vaccine during each pregnancy. Breastfeeding may pass some protective antibodies onto your baby By breastfeeding, you may pass some antibodies you have made in response to the vaccine to your baby. When you get a whooping cough vaccine during your pregnancy, you will have antibodies in your breast milk that you can share with your baby as soon as your milk comes in. However, your baby will not  get protective antibodies immediately if you wait to get the whooping cough vaccine until after delivering your baby. This is because it takes about 2 weeks for your body to create antibodies. Learn more about the health benefits of breastfeeding.  

## 2019-02-08 NOTE — Progress Notes (Signed)
I connected with  [name] on 02/08/19 at  2:15 PM EST by telephone and verified that I am speaking with the correct person using two identifiers.   I discussed the limitations, risks, security and privacy concerns of performing an evaluation and management service by telephone and the availability of in person appointments. I also discussed with the patient that there may be a patient responsible charge related to this service. The patient expressed understanding and agreed to proceed.  Scheryl Marten, RN 02/08/2019  2:25 PM   Getting tested for Covid tomorrow, does have symptoms

## 2019-02-22 ENCOUNTER — Telehealth: Payer: Self-pay | Admitting: Radiology

## 2019-02-22 NOTE — Telephone Encounter (Signed)
Left message for patient to schedule appointment to be seen for abdominal pain

## 2019-02-25 ENCOUNTER — Encounter: Payer: Managed Care, Other (non HMO) | Admitting: Family Medicine

## 2019-03-09 ENCOUNTER — Other Ambulatory Visit: Payer: Self-pay

## 2019-03-09 ENCOUNTER — Ambulatory Visit (INDEPENDENT_AMBULATORY_CARE_PROVIDER_SITE_OTHER): Payer: Managed Care, Other (non HMO) | Admitting: Obstetrics and Gynecology

## 2019-03-09 VITALS — BP 105/68 | HR 70 | Wt 168.0 lb

## 2019-03-09 DIAGNOSIS — Z23 Encounter for immunization: Secondary | ICD-10-CM | POA: Diagnosis not present

## 2019-03-09 DIAGNOSIS — Z3A27 27 weeks gestation of pregnancy: Secondary | ICD-10-CM

## 2019-03-09 DIAGNOSIS — Z3482 Encounter for supervision of other normal pregnancy, second trimester: Secondary | ICD-10-CM

## 2019-03-09 DIAGNOSIS — Z3480 Encounter for supervision of other normal pregnancy, unspecified trimester: Secondary | ICD-10-CM

## 2019-03-09 NOTE — Progress Notes (Signed)
Prenatal Visit Note Date: 03/09/2019 Clinic: [facility]  Subjective:  Samantha Knight is a 24 y.o. G2P1001 at [redacted]w[redacted]d being seen today for ongoing prenatal care.  She is currently monitored for the following issues for this high-risk pregnancy and has BMI 30.0-30.9,adult; Skin lesion; Supervision of other normal pregnancy, antepartum; and Anemia in pregnancy on their problem list.  Patient reports no complaints.   Contractions: Not present. Vag. Bleeding: None.  Movement: Present. Denies leaking of fluid.   The following portions of the patient's history were reviewed and updated as appropriate: allergies, current medications, past family history, past medical history, past social history, past surgical history and problem list. Problem list updated.  Objective:   Vitals:   03/09/19 0929  BP: 105/68  Pulse: 70  Weight: 168 lb (76.2 kg)    Fetal Status: Fetal Heart Rate (bpm): 143   Movement: Present     General:  Alert, oriented and cooperative. Patient is in no acute distress.  Skin: Skin is warm and dry. No rash noted.   Cardiovascular: Normal heart rate noted  Respiratory: Normal respiratory effort, no problems with respiration noted  Abdomen: Soft, gravid, appropriate for gestational age. Pain/Pressure: Absent     Pelvic:  Cervical exam deferred        Extremities: Normal range of motion.  Edema: Trace  Mental Status: Normal mood and affect. Normal behavior. Normal judgment and thought content.   Urinalysis:      Assessment and Plan:  Pregnancy: G2P1001 at [redacted]w[redacted]d  1. Supervision of other normal pregnancy, antepartum Routine care. bp cuff today - Glucose Tolerance, 2 Hours w/1 Hour - CBC - RPR - HIV Antibody (routine testing w rflx)  Preterm labor symptoms and general obstetric precautions including but not limited to vaginal bleeding, contractions, leaking of fluid and fetal movement were reviewed in detail with the patient. Please refer to After  Visit Summary for other counseling recommendations.  Return in about 2 weeks (around 03/23/2019) for low risk, virtual visit.   [place] Bing, MD

## 2019-03-10 LAB — CBC
Hematocrit: 29.7 % — ABNORMAL LOW (ref 34.0–46.6)
Hemoglobin: 9.9 g/dL — ABNORMAL LOW (ref 11.1–15.9)
MCH: 30.4 pg (ref 26.6–33.0)
MCHC: 33.3 g/dL (ref 31.5–35.7)
MCV: 91 fL (ref 79–97)
Platelets: 182 10*3/uL (ref 150–450)
RBC: 3.26 x10E6/uL — ABNORMAL LOW (ref 3.77–5.28)
RDW: 12.4 % (ref 11.7–15.4)
WBC: 7.5 10*3/uL (ref 3.4–10.8)

## 2019-03-10 LAB — GLUCOSE TOLERANCE, 2 HOURS W/ 1HR
Glucose, 1 hour: 108 mg/dL (ref 65–179)
Glucose, 2 hour: 89 mg/dL (ref 65–152)
Glucose, Fasting: 79 mg/dL (ref 65–91)

## 2019-03-10 LAB — HIV ANTIBODY (ROUTINE TESTING W REFLEX): HIV Screen 4th Generation wRfx: NONREACTIVE

## 2019-03-10 LAB — RPR: RPR Ser Ql: NONREACTIVE

## 2019-03-17 ENCOUNTER — Other Ambulatory Visit: Payer: Self-pay | Admitting: Obstetrics and Gynecology

## 2019-03-17 DIAGNOSIS — O99012 Anemia complicating pregnancy, second trimester: Secondary | ICD-10-CM

## 2019-03-17 MED ORDER — DOCUSATE SODIUM 100 MG PO CAPS: 100.0000 mg | ORAL_CAPSULE | Freq: Two times a day (BID) | ORAL | 1 refills | Status: DC

## 2019-03-17 MED ORDER — FERROUS GLUCONATE 324 (38 FE) MG PO TABS: 324.0000 mg | ORAL_TABLET | Freq: Every day | ORAL | 1 refills | Status: DC

## 2019-03-22 ENCOUNTER — Telehealth (INDEPENDENT_AMBULATORY_CARE_PROVIDER_SITE_OTHER): Payer: Managed Care, Other (non HMO) | Admitting: Obstetrics and Gynecology

## 2019-03-22 ENCOUNTER — Telehealth: Payer: Self-pay | Admitting: Radiology

## 2019-03-22 ENCOUNTER — Other Ambulatory Visit: Payer: Self-pay

## 2019-03-22 DIAGNOSIS — Z3480 Encounter for supervision of other normal pregnancy, unspecified trimester: Secondary | ICD-10-CM

## 2019-03-22 DIAGNOSIS — Z3A29 29 weeks gestation of pregnancy: Secondary | ICD-10-CM | POA: Diagnosis not present

## 2019-03-22 DIAGNOSIS — Z3483 Encounter for supervision of other normal pregnancy, third trimester: Secondary | ICD-10-CM

## 2019-03-22 DIAGNOSIS — O99012 Anemia complicating pregnancy, second trimester: Secondary | ICD-10-CM

## 2019-03-22 NOTE — Telephone Encounter (Signed)
Left message for patient to call the [facility] for mychart visit with Dr Vergie Living.

## 2019-03-22 NOTE — Progress Notes (Signed)
Having sharp pains that shoot down as well as  pressure and cramping   I connected with  [name] on 03/22/19 at  3:00 PM EST by telephone and verified that I am speaking with the correct person using two identifiers.   I discussed the limitations, risks, security and privacy concerns of performing an evaluation and management service by telephone and the availability of in person appointments. I also discussed with the patient that there may be a patient responsible charge related to this service. The patient expressed understanding and agreed to proceed.  Scheryl Marten, RN 03/22/2019  2:58 PM

## 2019-03-22 NOTE — Progress Notes (Signed)
   TELEHEALTH VIRTUAL OBSTETRICS VISIT ENCOUNTER NOTE  Clinic: [facility]  I connected with Samantha Knight on 03/22/19 at  3:00 PM EST by telephone at home and verified that I am speaking with the correct person using two identifiers.   I discussed the limitations, risks, security and privacy concerns of performing an evaluation and management service by telephone and the availability of in person appointments. I also discussed with the patient that there may be a patient responsible charge related to this service. The patient expressed understanding and agreed to proceed.  Subjective:  Samantha Knight is a 24 y.o. G2P1001 at [redacted]w[redacted]d being followed for ongoing prenatal care.  She is currently monitored for the following issues for this low-risk pregnancy and has BMI 30.0-30.9,adult; Skin lesion; Supervision of other normal pregnancy, antepartum; and Anemia in pregnancy on their problem list.  Patient reports see RN note. Reports fetal movement. Denies any contractions, bleeding or leaking of fluid.   The following portions of the patient's history were reviewed and updated as appropriate: allergies, current medications, past family history, past medical history, past social history, past surgical history and problem list.   Objective:   Vitals:   03/22/19 1457  BP: 110/79    Babyscripts Data Reviewed: not applicable  General:  Alert, oriented and cooperative.   Mental Status: Normal mood and affect perceived. Normal judgment and thought content.  Rest of physical exam deferred due to type of encounter  Assessment and Plan:  Pregnancy: G2P1001 at [redacted]w[redacted]d 1. Supervision of other normal pregnancy, antepartum Routine care  Preterm labor symptoms and general obstetric precautions including but not limited to vaginal bleeding, contractions, leaking of fluid and fetal movement were reviewed in detail with the patient.  I discussed the assessment and treatment plan  with the patient. The patient was provided an opportunity to ask questions and all were answered. The patient agreed with the plan and demonstrated an understanding of the instructions. The patient was advised to call back or seek an in-person office evaluation/go to MAU at [facility] for any urgent or concerning symptoms. Please refer to After Visit Summary for other counseling recommendations.   I provided 7 minutes of non-face-to-face time during this encounter. The visit was conducted via MyChart-medicine  No follow-ups on file.  Future Appointments  Date Time Provider Department Center  04/05/2019  3:00 PM Federico Flake, MD [facility]    [place] Bing, MD Center for [facility], [facility]

## 2019-03-25 ENCOUNTER — Other Ambulatory Visit: Payer: Self-pay

## 2019-03-25 ENCOUNTER — Other Ambulatory Visit (HOSPITAL_COMMUNITY)
Admission: RE | Admit: 2019-03-25 | Discharge: 2019-03-25 | Disposition: A | Discharge status: Home / Self Care | Payer: Managed Care, Other (non HMO) | Source: Ambulatory Visit | Attending: Obstetrics & Gynecology | Admitting: Obstetrics & Gynecology

## 2019-03-25 ENCOUNTER — Other Ambulatory Visit: Payer: Self-pay | Admitting: Advanced Practice Midwife

## 2019-03-25 ENCOUNTER — Ambulatory Visit (INDEPENDENT_AMBULATORY_CARE_PROVIDER_SITE_OTHER): Payer: Managed Care, Other (non HMO) | Admitting: *Deleted

## 2019-03-25 VITALS — BP 110/75 | HR 96

## 2019-03-25 DIAGNOSIS — N898 Other specified noninflammatory disorders of vagina: Secondary | ICD-10-CM | POA: Insufficient documentation

## 2019-03-25 DIAGNOSIS — O26893 Other specified pregnancy related conditions, third trimester: Secondary | ICD-10-CM | POA: Diagnosis present

## 2019-03-25 NOTE — Progress Notes (Signed)
Patient seen and assessed by nursing staff during this encounter. I have reviewed the chart and agree with the documentation and plan.  Jaynie Collins, MD 03/25/2019 10:13 AM

## 2019-03-25 NOTE — Progress Notes (Signed)
SUBJECTIVE:  24 y.o. female complains of vaginal itching and discharge a few day(s). Denies abnormal vaginal bleeding or significant pelvic pain or fever. No UTI symptoms. Denies history of known exposure to STD.  Patient's last menstrual period was 08/17/2018.  OBJECTIVE:  She appears well, afebrile. Urine dipstick: not done.  ASSESSMENT:  Vaginal Discharge  Vaginal Irritation    PLAN:  GC, chlamydia, trichomonas, BVAG, CVAG probe sent to lab. Treatment: To be determined once lab results are received ROV prn if symptoms persist or worsen.

## 2019-03-26 LAB — CERVICOVAGINAL ANCILLARY ONLY
Bacterial Vaginitis (gardnerella): NEGATIVE
Candida Glabrata: NEGATIVE
Candida Vaginitis: NEGATIVE
Chlamydia: NEGATIVE
Comment: NEGATIVE
Comment: NEGATIVE
Comment: NEGATIVE
Comment: NEGATIVE
Comment: NEGATIVE
Comment: NORMAL
Neisseria Gonorrhea: NEGATIVE
Trichomonas: NEGATIVE

## 2019-04-05 ENCOUNTER — Telehealth: Payer: Managed Care, Other (non HMO) | Admitting: Family Medicine

## 2019-04-06 ENCOUNTER — Other Ambulatory Visit: Payer: Self-pay

## 2019-04-06 ENCOUNTER — Telehealth (INDEPENDENT_AMBULATORY_CARE_PROVIDER_SITE_OTHER): Payer: Managed Care, Other (non HMO) | Admitting: Obstetrics & Gynecology

## 2019-04-06 DIAGNOSIS — O99013 Anemia complicating pregnancy, third trimester: Secondary | ICD-10-CM

## 2019-04-06 DIAGNOSIS — Z3480 Encounter for supervision of other normal pregnancy, unspecified trimester: Secondary | ICD-10-CM

## 2019-04-06 DIAGNOSIS — Z3A31 31 weeks gestation of pregnancy: Secondary | ICD-10-CM

## 2019-04-06 DIAGNOSIS — O99012 Anemia complicating pregnancy, second trimester: Secondary | ICD-10-CM

## 2019-04-06 NOTE — Progress Notes (Signed)
Pt concerned about the baby moving too much   I connected with  [name] on 04/06/19 at  3:00 PM EDT by telephone and verified that I am speaking with the correct person using two identifiers.   I discussed the limitations, risks, security and privacy concerns of performing an evaluation and management service by telephone and the availability of in person appointments. I also discussed with the patient that there may be a patient responsible charge related to this service. The patient expressed understanding and agreed to proceed.  Scheryl Marten, RN 04/06/2019  2:29 PM

## 2019-04-06 NOTE — Progress Notes (Signed)
   OBSTETRICS PRENATAL VIRTUAL VISIT ENCOUNTER NOTE  Provider location: Center for [facility] at [facility]   I connected with Samantha Knight on 04/06/19 at  3:00 PM EDT by MyChart Video Encounter at home and verified that I am speaking with the correct person using two identifiers.   I discussed the limitations, risks, security and privacy concerns of performing an evaluation and management service virtually and the availability of in person appointments. I also discussed with the patient that there may be a patient responsible charge related to this service. The patient expressed understanding and agreed to proceed. Subjective:  Samantha Knight is a 24 y.o. G2P1001 at [redacted]w[redacted]d being seen today for ongoing prenatal care.  She is currently monitored for the following issues for this low-risk pregnancy and has BMI 30.0-30.9,adult; Skin lesion; Supervision of other normal pregnancy, antepartum; and Anemia in pregnancy on their problem list.  Patient reports no complaints.  Contractions: Not present. Vag. Bleeding: None.  Movement: Present. Denies any leaking of fluid.   The following portions of the patient's history were reviewed and updated as appropriate: allergies, current medications, past family history, past medical history, past social history, past surgical history and problem list.   Objective:   Vitals:   04/06/19 1428  BP: 108/76    Fetal Status:     Movement: Present     General:  Alert, oriented and cooperative. Patient is in no acute distress.  Respiratory: Normal respiratory effort, no problems with respiration noted  Mental Status: Normal mood and affect. Normal behavior. Normal judgment and thought content.  Rest of physical exam deferred due to type of encounter  Assessment and Plan:  Pregnancy: G2P1001 at [redacted]w[redacted]d 1. Anemia during pregnancy in second trimester Taking oral iron as prescribed.  Advised to take at same time as prenatal vitamin as the Vitamin C  helps with iron absorption.   2. Supervision of other normal pregnancy, antepartum Desires office visit to check fetal heart rate and fundal height next visit, this will be scheduled. Preterm labor symptoms and general obstetric precautions including but not limited to vaginal bleeding, contractions, leaking of fluid and fetal movement were reviewed in detail with the patient. I discussed the assessment and treatment plan with the patient. The patient was provided an opportunity to ask questions and all were answered. The patient agreed with the plan and demonstrated an understanding of the instructions. The patient was advised to call back or seek an in-person office evaluation/go to MAU at [facility] for any urgent or concerning symptoms. Please refer to After Visit Summary for other counseling recommendations.   I provided 8 minutes of face-to-face time during this encounter.  Return in about 2 weeks (around 04/20/2019) for OFFICE OB Visit, FHR check.  Future Appointments  Date Time Provider Department Center  04/06/2019  3:00 PM Anyanwu, Jethro Bastos, MD [facility]  05/04/2019  2:00 PM Crowley Bing, MD [facility]    Jaynie Collins, MD Center for [facility], [facility]

## 2019-04-06 NOTE — Patient Instructions (Signed)
Return to office for any scheduled appointments. Call the office or go to the [facility] at [facility] [place] if:  You begin to have strong, frequent contractions  Your water breaks.  Sometimes it is a big gush of fluid, sometimes it is just a trickle that keeps getting your panties wet or running down your legs  You have vaginal bleeding.  It is normal to have a small amount of spotting if your cervix was checked.   You do not feel your baby moving like normal.  If you do not, get something to eat and drink and lay down and focus on feeling your baby move.   If your baby is still not moving like normal, you should call the office or go to MAU.  Any other obstetric concerns.   

## 2019-04-18 ENCOUNTER — Encounter (HOSPITAL_COMMUNITY): Payer: Self-pay | Admitting: Family Medicine

## 2019-04-18 ENCOUNTER — Inpatient Hospital Stay (HOSPITAL_COMMUNITY)
Admission: AD | Admit: 2019-04-18 | Discharge: 2019-04-18 | Disposition: A | Discharge status: Home / Self Care | Payer: Managed Care, Other (non HMO) | Attending: Family Medicine | Admitting: Family Medicine

## 2019-04-18 ENCOUNTER — Other Ambulatory Visit: Payer: Self-pay

## 2019-04-18 DIAGNOSIS — K5900 Constipation, unspecified: Secondary | ICD-10-CM | POA: Insufficient documentation

## 2019-04-18 DIAGNOSIS — Z3A36 36 weeks gestation of pregnancy: Secondary | ICD-10-CM

## 2019-04-18 DIAGNOSIS — O4703 False labor before 37 completed weeks of gestation, third trimester: Secondary | ICD-10-CM | POA: Diagnosis present

## 2019-04-18 DIAGNOSIS — D649 Anemia, unspecified: Secondary | ICD-10-CM | POA: Insufficient documentation

## 2019-04-18 DIAGNOSIS — O99012 Anemia complicating pregnancy, second trimester: Secondary | ICD-10-CM

## 2019-04-18 DIAGNOSIS — Z3A32 32 weeks gestation of pregnancy: Secondary | ICD-10-CM | POA: Insufficient documentation

## 2019-04-18 DIAGNOSIS — R42 Dizziness and giddiness: Secondary | ICD-10-CM

## 2019-04-18 DIAGNOSIS — O99613 Diseases of the digestive system complicating pregnancy, third trimester: Secondary | ICD-10-CM | POA: Insufficient documentation

## 2019-04-18 DIAGNOSIS — O26893 Other specified pregnancy related conditions, third trimester: Secondary | ICD-10-CM | POA: Insufficient documentation

## 2019-04-18 DIAGNOSIS — O99013 Anemia complicating pregnancy, third trimester: Secondary | ICD-10-CM | POA: Insufficient documentation

## 2019-04-18 DIAGNOSIS — D509 Iron deficiency anemia, unspecified: Secondary | ICD-10-CM

## 2019-04-18 DIAGNOSIS — R531 Weakness: Secondary | ICD-10-CM | POA: Insufficient documentation

## 2019-04-18 LAB — URINALYSIS, ROUTINE W REFLEX MICROSCOPIC
Bilirubin Urine: NEGATIVE
Glucose, UA: NEGATIVE mg/dL
Hgb urine dipstick: NEGATIVE
Ketones, ur: 5 mg/dL — AB
Nitrite: NEGATIVE
Protein, ur: NEGATIVE mg/dL
Specific Gravity, Urine: 1.023 (ref 1.005–1.030)
pH: 6 (ref 5.0–8.0)

## 2019-04-18 LAB — COMPREHENSIVE METABOLIC PANEL
ALT: 18 U/L (ref 0–44)
AST: 22 U/L (ref 15–41)
Albumin: 2.8 g/dL — ABNORMAL LOW (ref 3.5–5.0)
Alkaline Phosphatase: 136 U/L — ABNORMAL HIGH (ref 38–126)
Anion gap: 9 (ref 5–15)
BUN: 5 mg/dL — ABNORMAL LOW (ref 6–20)
CO2: 19 mmol/L — ABNORMAL LOW (ref 22–32)
Calcium: 8.3 mg/dL — ABNORMAL LOW (ref 8.9–10.3)
Chloride: 106 mmol/L (ref 98–111)
Creatinine, Ser: 0.54 mg/dL (ref 0.44–1.00)
GFR calc Af Amer: >60 mL/min (ref >60)
GFR calc non Af Amer: >60 mL/min (ref >60)
Glucose, Bld: 106 mg/dL — ABNORMAL HIGH (ref 70–99)
Potassium: 3.8 mmol/L (ref 3.5–5.1)
Sodium: 134 mmol/L — ABNORMAL LOW (ref 135–145)
Total Bilirubin: 0.2 mg/dL — ABNORMAL LOW (ref 0.3–1.2)
Total Protein: 6.3 g/dL — ABNORMAL LOW (ref 6.5–8.1)

## 2019-04-18 LAB — CBC
HCT: 30.1 % — ABNORMAL LOW (ref 36.0–46.0)
Hemoglobin: 9.7 g/dL — ABNORMAL LOW (ref 12.0–15.0)
MCH: 29.4 pg (ref 26.0–34.0)
MCHC: 32.2 g/dL (ref 30.0–36.0)
MCV: 91.2 fL (ref 80.0–100.0)
Platelets: 182 10*3/uL (ref 150–400)
RBC: 3.3 MIL/uL — ABNORMAL LOW (ref 3.87–5.11)
RDW: 13.4 % (ref 11.5–15.5)
WBC: 7.2 10*3/uL (ref 4.0–10.5)
nRBC: 0 % (ref 0.0–0.2)

## 2019-04-18 LAB — WET PREP, GENITAL
Clue Cells Wet Prep HPF POC: NONE SEEN
Sperm: NONE SEEN
Trich, Wet Prep: NONE SEEN
Yeast Wet Prep HPF POC: NONE SEEN

## 2019-04-18 MED ORDER — POLYETHYLENE GLYCOL 3350 17 GM/SCOOP PO POWD: 17.0000 g | Freq: Every day | ORAL | 2 refills | Status: DC | PRN

## 2019-04-18 NOTE — Discharge Instructions (Signed)
Braxton Hicks Contractions Contractions of the uterus can occur throughout pregnancy, but they are not always a sign that you are in labor. You may have practice contractions called Braxton Hicks contractions. These false labor contractions are sometimes confused with true labor. What are Braxton Hicks contractions? Braxton Hicks contractions are tightening movements that occur in the muscles of the uterus before labor. Unlike true labor contractions, these contractions do not result in opening (dilation) and thinning of the cervix. Toward the end of pregnancy (32-34 weeks), Braxton Hicks contractions can happen more often and may become stronger. These contractions are sometimes difficult to tell apart from true labor because they can be very uncomfortable. You should not feel embarrassed if you go to the hospital with false labor. Sometimes, the only way to tell if you are in true labor is for your health care provider to look for changes in the cervix. The health care provider will do a physical exam and may monitor your contractions. If you are not in true labor, the exam should show that your cervix is not dilating and your water has not broken. If there are no other health problems associated with your pregnancy, it is completely safe for you to be sent home with false labor. You may continue to have Braxton Hicks contractions until you go into true labor. How to tell the difference between true labor and false labor True labor  Contractions last 30-70 seconds.  Contractions become very regular.  Discomfort is usually felt in the top of the uterus, and it spreads to the lower abdomen and low back.  Contractions do not go away with walking.  Contractions usually become more intense and increase in frequency.  The cervix dilates and gets thinner. False labor  Contractions are usually shorter and not as strong as true labor contractions.  Contractions are usually irregular.  Contractions  are often felt in the front of the lower abdomen and in the groin.  Contractions may go away when you walk around or change positions while lying down.  Contractions get weaker and are shorter-lasting as time goes on.  The cervix usually does not dilate or become thin. Follow these instructions at home:   Take over-the-counter and prescription medicines only as told by your health care provider.  Keep up with your usual exercises and follow other instructions from your health care provider.  Eat and drink lightly if you think you are going into labor.  If Braxton Hicks contractions are making you uncomfortable: ? Change your position from lying down or resting to walking, or change from walking to resting. ? Sit and rest in a tub of warm water. ? Drink enough fluid to keep your urine pale yellow. Dehydration may cause these contractions. ? Do slow and deep breathing several times an hour.  Keep all follow-up prenatal visits as told by your health care provider. This is important. Contact a health care provider if:  You have a fever.  You have continuous pain in your abdomen. Get help right away if:  Your contractions become stronger, more regular, and closer together.  You have fluid leaking or gushing from your vagina.  You pass blood-tinged mucus (bloody show).  You have bleeding from your vagina.  You have low back pain that you never had before.  You feel your baby's head pushing down and causing pelvic pressure.  Your baby is not moving inside you as much as it used to. Summary  Contractions that occur before labor are   called Braxton Hicks contractions, false labor, or practice contractions.  Braxton Hicks contractions are usually shorter, weaker, farther apart, and less regular than true labor contractions. True labor contractions usually become progressively stronger and regular, and they become more frequent.  Manage discomfort from [facility] contractions  by changing position, resting in a warm bath, drinking plenty of water, or practicing deep breathing. This information is not intended to replace advice given to you by your health care provider. Make sure you discuss any questions you have with your health care provider. Document Revised: 12/13/2016 Document Reviewed: 05/16/2016 Elsevier Patient Education  [place].   Dizziness Dizziness is a common problem. It is a feeling of unsteadiness or light-headedness. You may feel like you are about to faint. Dizziness can lead to injury if you stumble or fall. Anyone can become dizzy, but dizziness is more common in older adults. This condition can be caused by a number of things, including medicines, dehydration, or illness. Follow these instructions at home: Eating and drinking  Drink enough fluid to keep your urine clear or pale yellow. This helps to keep you from becoming dehydrated. Try to drink more clear fluids, such as water.  Do not drink alcohol.  Limit your caffeine intake if told to do so by your health care provider. Check ingredients and nutrition facts to see if a food or beverage contains caffeine.  Limit your salt (sodium) intake if told to do so by your health care provider. Check ingredients and nutrition facts to see if a food or beverage contains sodium. Activity  Avoid making quick movements. ? Rise slowly from chairs and steady yourself until you feel okay. ? In the morning, first sit up on the side of the bed. When you feel okay, stand slowly while you hold onto something until you know that your balance is fine.  If you need to stand in one place for a long time, move your legs often. Tighten and relax the muscles in your legs while you are standing.  Do not drive or use heavy machinery if you feel dizzy.  Avoid bending down if you feel dizzy. Place items in your home so that they are easy for you to reach without leaning over. Lifestyle  Do not use any  products that contain nicotine or tobacco, such as cigarettes and e-cigarettes. If you need help quitting, ask your health care provider.  Try to reduce your stress level by using methods such as yoga or meditation. Talk with your health care provider if you need help to manage your stress. General instructions  Watch your dizziness for any changes.  Take over-the-counter and prescription medicines only as told by your health care provider. Talk with your health care provider if you think that your dizziness is caused by a medicine that you are taking.  Tell a friend or a family member that you are feeling dizzy. If he or she notices any changes in your behavior, have this person call your health care provider.  Keep all follow-up visits as told by your health care provider. This is important. Contact a health care provider if:  Your dizziness does not go away.  Your dizziness or light-headedness gets worse.  You feel nauseous.  You have reduced hearing.  You have new symptoms.  You are unsteady on your feet or you feel like the room is spinning. Get help right away if:  You vomit or have diarrhea and are unable to eat or drink anything.  You have problems talking, walking, swallowing, or using your arms, hands, or legs.  You feel generally weak.  You are not thinking clearly or you have trouble forming sentences. It may take a friend or family member to notice this.  You have chest pain, abdominal pain, shortness of breath, or sweating.  Your vision changes.  You have any bleeding.  You have a severe headache.  You have neck pain or a stiff neck.  You have a fever. These symptoms may represent a serious problem that is an emergency. Do not wait to see if the symptoms will go away. Get medical help right away. Call your local emergency services (911 in the [place] Do not drive yourself to the hospital. Summary  Dizziness is a feeling of unsteadiness or  light-headedness. This condition can be caused by a number of things, including medicines, dehydration, or illness.  Anyone can become dizzy, but dizziness is more common in older adults.  Drink enough fluid to keep your urine clear or pale yellow. Do not drink alcohol.  Avoid making quick movements if you feel dizzy. Monitor your dizziness for any changes. This information is not intended to replace advice given to you by your health care provider. Make sure you discuss any questions you have with your health care provider. Document Revised: 01/03/2017 Document Reviewed: 02/03/2016 Elsevier Patient Education  2020 [employer].

## 2019-04-18 NOTE — MAU Provider Note (Signed)
CC:  Chief Complaint  Patient presents with  . Abdominal Pain  . Dizziness     First Provider Initiated Contact with Patient 04/18/19 1313       HPI: Samantha Knight is a 24 y.o. year old G20P1001 female at [redacted]w[redacted]d weeks gestation who presents to MAU reporting irreg contractions, dizziness, weakness in bilat arms and legs today. Hasn't had a good appetite lately. Feels full quickly.   Pain score: 7/10  Associated Sx: Negative for fever, chills, urinary complaints, nausea, vomiting, diarrhea, vaginal bleeding or leaking of fluid.  Positive for vaginal discharge, ice cravings and constipation. Fetal movement: Normal  Patient also very concerned about only being able to have 1 support person in labor.  Is exploring the possibility of birth center birth or home birth due to wanting to have unrestricted support people.  O:  Patient Vitals for the past 24 hrs:  BP Temp Temp src Pulse Resp SpO2 Height Weight  04/18/19 1450 110/72 - - 97 - - - -  04/18/19 1447 99/66 - - 95 - - - -  04/18/19 1446 98/63 - - 88 - - - -  04/18/19 1445 (!) 99/57 - - 90 - - - -  04/18/19 1229 112/67 98.5 F (36.9 C) Oral 98 16 100 % 5\' 3"  (1.6 m) 76.1 kg    General: NAD Skin: No pallor or cyanosis Heart: Regular rate Lungs: Normal rate and effort Abd: Soft, NT, Gravid, S=D Pelvic: NEFG, negative pooling, negative blood.  Dilation: Closed Effacement (%): Thick Exam by:: Blayne Garlick cnm  EFM: 130, Moderate variability, 15 x 15 accelerations, no decelerations Toco: Contractions rare, mild  Results for orders placed or performed during the hospital encounter of 04/18/19 (from the past 24 hour(s))  Urinalysis, Routine w reflex microscopic     Status: Abnormal   Collection Time: 04/18/19  1:21 PM  Result Value Ref Range   Color, Urine YELLOW YELLOW   APPearance HAZY (A) CLEAR   Specific Gravity, Urine 1.023 1.005 - 1.030   pH 6.0 5.0 - 8.0   Glucose, UA NEGATIVE NEGATIVE mg/dL   Hgb urine dipstick NEGATIVE  NEGATIVE   Bilirubin Urine NEGATIVE NEGATIVE   Ketones, ur 5 (A) NEGATIVE mg/dL   Protein, ur NEGATIVE NEGATIVE mg/dL   Nitrite NEGATIVE NEGATIVE   Leukocytes,Ua SMALL (A) NEGATIVE   RBC / HPF 0-5 0 - 5 RBC/hpf   WBC, UA 6-10 0 - 5 WBC/hpf   Bacteria, UA RARE (A) NONE SEEN   Squamous Epithelial / LPF 0-5 0 - 5   Mucus PRESENT   CBC     Status: Abnormal   Collection Time: 04/18/19  1:34 PM  Result Value Ref Range   WBC 7.2 4.0 - 10.5 K/uL   RBC 3.30 (L) 3.87 - 5.11 MIL/uL   Hemoglobin 9.7 (L) 12.0 - 15.0 g/dL   HCT 06/18/19 (L) 16.1 - 09.6 %   MCV 91.2 80.0 - 100.0 fL   MCH 29.4 26.0 - 34.0 pg   MCHC 32.2 30.0 - 36.0 g/dL   RDW 04.5 40.9 - 81.1 %   Platelets 182 150 - 400 K/uL   nRBC 0.0 0.0 - 0.2 %  Comprehensive metabolic panel     Status: Abnormal   Collection Time: 04/18/19  1:34 PM  Result Value Ref Range   Sodium 134 (L) 135 - 145 mmol/L   Potassium 3.8 3.5 - 5.1 mmol/L   Chloride 106 98 - 111 mmol/L   CO2 19 (L) 22 -  32 mmol/L   Glucose, Bld 106 (H) 70 - 99 mg/dL   BUN 5 (L) 6 - 20 mg/dL   Creatinine, Ser 8.18 0.44 - 1.00 mg/dL   Calcium 8.3 (L) 8.9 - 10.3 mg/dL   Total Protein 6.3 (L) 6.5 - 8.1 g/dL   Albumin 2.8 (L) 3.5 - 5.0 g/dL   AST 22 15 - 41 U/L   ALT 18 0 - 44 U/L   Alkaline Phosphatase 136 (H) 38 - 126 U/L   Total Bilirubin 0.2 (L) 0.3 - 1.2 mg/dL   GFR calc non Af Amer >60 >60 mL/min   GFR calc Af Amer >60 >60 mL/min   Anion gap 9 5 - 15  Wet prep, genital     Status: Abnormal   Collection Time: 04/18/19  1:51 PM  Result Value Ref Range   Yeast Wet Prep HPF POC NONE SEEN NONE SEEN   Trich, Wet Prep NONE SEEN NONE SEEN   Clue Cells Wet Prep HPF POC NONE SEEN NONE SEEN   WBC, Wet Prep HPF POC MANY (A) NONE SEEN   Sperm NONE SEEN      Orders Placed This Encounter  Procedures  . Wet prep, genital  . Urinalysis, Routine w reflex microscopic  . CBC  . Comprehensive metabolic panel  . Fetal fibronectin  . Orthostatic vital signs  . Lab instructions   . Discharge patient   Meds ordered this encounter  Medications  . polyethylene glycol powder (GLYCOLAX/MIRALAX) 17 GM/SCOOP powder    Sig: Take 17 g by mouth daily as needed for moderate constipation.    Dispense:  500 g    Refill:  2    Order Specific Question:   Supervising Provider    Answer:   Reva Bores [[id number]]   Patient encouraged to p.o. hydrate and eat.  Feeling better.  No weakness or dizziness currently.  MDM - Preterm contractions without evidence of active preterm labor.  Possibly due to inadequate hydration.  Contractions decreased with p.o. hydration.  - Generalized weakness and dizziness likely due to combination of anemia, dehydration and poor p.o. intake.  Patient admits that she is rarely taking her iron supplement and is not eating or drinking well.  Feels better after eating and drinking and MAU.  Encouraged to take iron supplement with food daily and increase dietary iron.  - Pica.  Encouraged to take iron supplements, fill up on nutrient-dense foods instead of ice.  - Constipation pregnancy.  Encouraged to increase fluids and fiber.  Switch to MiraLAX.  A: [redacted]w[redacted]d week IUP 1. Preterm uterine contractions in third trimester, antepartum   2. Anemia during pregnancy in second trimester   3. Dizziness   4. Weakness generalized   5. Constipation during pregnancy in third trimester   FHR reactive  P:Discharge home in stable condition Preterm labor precautions and fetal kick counts. Discussed gradual improvement in visitation policy.  Encourage patient to carefully consider motivations for desiring out of hospital birth and informed her that it is often difficult to transfer care late in pregnancy.  Contact information given. Follow-up Information    [facility] for [facility] at [facility] Follow up.   Specialty: Obstetrics and Gynecology Why: As scheduled or sooner as needed if symptoms worsen Contact information: [address] [place] [address] [phone]       [facility] Follow up.   Specialty: Obstetrics and Gynecology Why: Needed in pregnancy emergencies Contact information: [address] [id number] [address] [place] Follow up.   Specialty: Emergency Medicine Why: As needed for worsening weakness or dizziness Contact information: [address] [id number] [place] [phone]       [facility] Follow up.   Contact information: [email or web address] Address: [address] [place], [place] [address] Phone: [phone]       Izora Gala Harmon,CNM Follow up.   Contact information: [phone]         Allergies as of 04/18/2019   No Known Allergies     Medication List    TAKE these medications   acetaminophen 500 MG tablet Commonly known as: TYLENOL Take 1,000 mg by mouth every 6 (six) hours as needed for moderate pain.   Concept OB 130-92.4-1 MG Caps Take 1 tablet by mouth daily.   diphenhydrAMINE 25 MG tablet Commonly known as: BENADRYL Take 25 mg by mouth every 6 (six) hours as needed for allergies.   docusate sodium 100 MG capsule Commonly known as: Colace Take 1 capsule (100 mg total) by mouth 2 (two) times daily.   Doxylamine-Pyridoxine 10-10 MG Tbec Commonly known as: Diclegis Take 2 tablets at bedtime and one in the morning and one in the afternoon as needed for nausea.   ferrous gluconate 324 MG tablet Commonly known as: FERGON Take 1 tablet (324 mg total) by mouth daily with breakfast.   polyethylene glycol powder 17 GM/SCOOP powder Commonly known as: GLYCOLAX/MIRALAX Take 17 g by mouth daily as needed for moderate constipation.   promethazine 25 MG tablet Commonly known as: PHENERGAN TAKE 1 TABLET (25 MG TOTAL) BY MOUTH EVERY 6 (SIX) HOURS AS NEEDED FOR NAUSEA OR VOMITING.    Vitafol Gummies 3.33-0.333-34.8 MG Chew Chew 1 tablet by mouth daily.        Tamala Julian, [place], [place] 04/18/2019 3:04 PM  3

## 2019-04-18 NOTE — MAU Note (Signed)
Samantha Knight is a 24 y.o. at [redacted]w[redacted]d here in MAU reporting: states she has been feeling weak and dizzy. Is feeling some contractions, also reporting constipation. No bleeding or LOF. +FM  Onset of complaint: today  Pain score: 7/10  Vitals:   04/18/19 1229  BP: 112/67  Pulse: 98  Resp: 16  Temp: 98.5 F (36.9 C)  SpO2: 100%     FHT: +FM  Lab orders placed from triage: UA

## 2019-04-19 LAB — GC/CHLAMYDIA PROBE AMP (~~LOC~~) NOT AT ARMC
Chlamydia: NEGATIVE
Comment: NEGATIVE
Comment: NORMAL
Neisseria Gonorrhea: NEGATIVE

## 2019-04-20 ENCOUNTER — Encounter: Payer: Managed Care, Other (non HMO) | Admitting: Obstetrics & Gynecology

## 2019-04-22 ENCOUNTER — Ambulatory Visit (INDEPENDENT_AMBULATORY_CARE_PROVIDER_SITE_OTHER): Payer: Managed Care, Other (non HMO) | Admitting: Obstetrics & Gynecology

## 2019-04-22 ENCOUNTER — Encounter: Payer: Self-pay | Admitting: Obstetrics & Gynecology

## 2019-04-22 ENCOUNTER — Other Ambulatory Visit: Payer: Self-pay

## 2019-04-22 VITALS — BP 107/76 | HR 72 | Wt 167.2 lb

## 2019-04-22 DIAGNOSIS — Z3A33 33 weeks gestation of pregnancy: Secondary | ICD-10-CM

## 2019-04-22 DIAGNOSIS — D509 Iron deficiency anemia, unspecified: Secondary | ICD-10-CM

## 2019-04-22 DIAGNOSIS — Z3480 Encounter for supervision of other normal pregnancy, unspecified trimester: Secondary | ICD-10-CM

## 2019-04-22 DIAGNOSIS — O99013 Anemia complicating pregnancy, third trimester: Secondary | ICD-10-CM

## 2019-04-22 NOTE — Patient Instructions (Addendum)
Return to office for any scheduled appointments. Call the office or go to the MAU at [facility] if:  You begin to have strong, frequent contractions  Your water breaks.  Sometimes it is a big gush of fluid, sometimes it is just a trickle that keeps getting your panties wet or running down your legs  You have vaginal bleeding.  It is normal to have a small amount of spotting if your cervix was checked.   You do not feel your baby moving like normal.  If you do not, get something to eat and drink and lay down and focus on feeling your baby move.   If your baby is still not moving like normal, you should call the office or go to MAU.  Any other obstetric concerns.   Contraception Choices Contraception, also called birth control, refers to methods or devices that prevent pregnancy. Hormonal methods Contraceptive implant  A contraceptive implant is a thin, plastic tube that contains a hormone. It is inserted into the upper part of the arm. It can remain in place for up to 3 years. Progestin-only injections Progestin-only injections are injections of progestin, a synthetic form of the hormone progesterone. They are given every 3 months by a health care provider. Birth control pills  Birth control pills are pills that contain hormones that prevent pregnancy. They must be taken once a day, preferably at the same time each day. Birth control patch  The birth control patch contains hormones that prevent pregnancy. It is placed on the skin and must be changed once a week for three weeks and removed on the fourth week. A prescription is needed to use this method of contraception. Vaginal ring  A vaginal ring contains hormones that prevent pregnancy. It is placed in the vagina for three weeks and removed on the fourth week. After that, the process is repeated with a new ring. A prescription is needed to use this method of contraception. Emergency  contraceptive Emergency contraceptives prevent pregnancy after unprotected sex. They come in pill form and can be taken up to 5 days after sex. They work best the sooner they are taken after having sex. Most emergency contraceptives are available without a prescription. This method should not be used as your only form of birth control. Barrier methods Female condom  A female condom is a thin sheath that is worn over the penis during sex. Condoms keep sperm from going inside a woman's body. They can be used with a spermicide to increase their effectiveness. They should be disposed after a single use. Female condom  A female condom is a soft, loose-fitting sheath that is put into the vagina before sex. The condom keeps sperm from going inside a woman's body. They should be disposed after a single use. Diaphragm  A diaphragm is a soft, dome-shaped barrier. It is inserted into the vagina before sex, along with a spermicide. The diaphragm blocks sperm from entering the uterus, and the spermicide kills sperm. A diaphragm should be left in the vagina for 6-8 hours after sex and removed within 24 hours. A diaphragm is prescribed and fitted by a health care provider. A diaphragm should be replaced every 1-2 years, after giving birth, after gaining more than 15 lb (6.8 kg), and after pelvic surgery. Cervical cap  A cervical cap is a round, soft latex or plastic cup that fits over the cervix. It is inserted into the vagina before sex, along with spermicide. It blocks  sperm from entering the uterus. The cap should be left in place for 6-8 hours after sex and removed within 48 hours. A cervical cap must be prescribed and fitted by a health care provider. It should be replaced every 2 years. Sponge  A sponge is a soft, circular piece of polyurethane foam with spermicide on it. The sponge helps block sperm from entering the uterus, and the spermicide kills sperm. To use it, you make it wet and then insert it into the  vagina. It should be inserted before sex, left in for at least 6 hours after sex, and removed and thrown away within 30 hours. Spermicides Spermicides are chemicals that kill or block sperm from entering the cervix and uterus. They can come as a cream, jelly, suppository, foam, or tablet. A spermicide should be inserted into the vagina with an applicator at least 10-15 minutes before sex to allow time for it to work. The process must be repeated every time you have sex. Spermicides do not require a prescription. Intrauterine contraception Intrauterine device (IUD) An IUD is a T-shaped device that is put in a woman's uterus. There are two types:  Hormone IUD.This type contains progestin, a synthetic form of the hormone progesterone. This type can stay in place for 3-5 years.  Copper IUD.This type is wrapped in copper wire. It can stay in place for 10 years.  Permanent methods of contraception Female tubal ligation In this method, a woman's fallopian tubes are sealed, tied, or blocked during surgery to prevent eggs from traveling to the uterus. Hysteroscopic sterilization In this method, a small, flexible insert is placed into each fallopian tube. The inserts cause scar tissue to form in the fallopian tubes and block them, so sperm cannot reach an egg. The procedure takes about 3 months to be effective. Another form of birth control must be used during those 3 months. Female sterilization This is a procedure to tie off the tubes that carry sperm (vasectomy). After the procedure, the man can still ejaculate fluid (semen). Natural planning methods Natural family planning In this method, a couple does not have sex on days when the woman could become pregnant. Calendar method This means keeping track of the length of each menstrual cycle, identifying the days when pregnancy can happen, and not having sex on those days. Ovulation method In this method, a couple avoids sex during  ovulation. Symptothermal method This method involves not having sex during ovulation. The woman typically checks for ovulation by watching changes in her temperature and in the consistency of cervical mucus. Post-ovulation method In this method, a couple waits to have sex until after ovulation. Summary  Contraception, also called birth control, means methods or devices that prevent pregnancy.  Hormonal methods of contraception include implants, injections, pills, patches, vaginal rings, and emergency contraceptives.  Barrier methods of contraception can include female condoms, female condoms, diaphragms, cervical caps, sponges, and spermicides.  There are two types of IUDs (intrauterine devices). An IUD can be put in a woman's uterus to prevent pregnancy for 3-5 years.  Permanent sterilization can be done through a procedure for males, females, or both.  Natural family planning methods involve not having sex on days when the woman could become pregnant. This information is not intended to replace advice given to you by your health care provider. Make sure you discuss any questions you have with your health care provider. Document Revised: 01/02/2017 Document Reviewed: 02/03/2016 Elsevier Patient Education  2020 [employer].

## 2019-04-22 NOTE — Progress Notes (Signed)
   PRENATAL VISIT NOTE  Subjective:  Samantha Knight is a 24 y.o. G2P1001 at [redacted]w[redacted]d being seen today for ongoing prenatal care.  She is currently monitored for the following issues for this low-risk pregnancy and has BMI 30.0-30.9,adult; Skin lesion; Supervision of other normal pregnancy, antepartum; and Maternal iron deficiency anemia affecting pregnancy, antepartum, third trimester on their problem list.  Patient reports feeling weak and dizzy and tired.  Feels it is sure to her anemia, recent hemoglobin was 9.7.  On daily iron supplement for months,, her hemoglobin prior to this was 9.9.  No current CP or SOB but she reports she gets this sometimes.  Contractions: Not present.  .  Movement: Present. Denies leaking of fluid.   The following portions of the patient's history were reviewed and updated as appropriate: allergies, current medications, past family history, past medical history, past social history, past surgical history and problem list.   Objective:   Vitals:   04/22/19 0916  BP: 107/76  Pulse: 72  Weight: 167 lb 3.2 oz (75.8 kg)    Fetal Status: Fetal Heart Rate (bpm): 142 Fundal Height: 33 cm Movement: Present     General:  Alert, oriented and cooperative. Patient is in no acute distress.  Skin: Skin is warm and dry. No rash noted.   Cardiovascular: Normal heart rate noted  Respiratory: Normal respiratory effort, no problems with respiration noted  Abdomen: Soft, gravid, appropriate for gestational age.  Pain/Pressure: Present     Pelvic: Cervical exam deferred        Extremities: Normal range of motion.  Edema: None  Mental Status: Normal mood and affect. Normal behavior. Normal judgment and thought content.   Assessment and Plan:  Pregnancy: G2P1001 at [redacted]w[redacted]d 1. Maternal iron deficiency anemia affecting pregnancy, antepartum, third trimester Ordered for IV Feraheme given symptoms.  Will be done at [facility]. Will continue to monitor her symptoms. - ferumoxytol  (FERAHEME) 510 mg in sodium chloride 0.9 % 100 mL IVPB weekly x 2 doses  2. Supervision of other normal pregnancy, antepartum Preterm labor symptoms and general obstetric precautions including but not limited to vaginal bleeding, contractions, leaking of fluid and fetal movement were reviewed in detail with the patient. Please refer to After Visit Summary for other counseling recommendations.   Return in about 2 weeks (around 05/06/2019) for OFFICE OB Visit.  Future Appointments  Date Time Provider Department Center  05/04/2019  2:00 PM [place] Bing, MD [facility]    Jaynie Collins, MD

## 2019-04-25 ENCOUNTER — Encounter (HOSPITAL_COMMUNITY): Payer: Self-pay | Admitting: Obstetrics & Gynecology

## 2019-04-25 ENCOUNTER — Other Ambulatory Visit: Payer: Self-pay

## 2019-04-25 ENCOUNTER — Inpatient Hospital Stay (HOSPITAL_COMMUNITY)
Admission: AD | Admit: 2019-04-25 | Discharge: 2019-04-25 | Disposition: A | Discharge status: Home / Self Care | Payer: Managed Care, Other (non HMO) | Attending: Obstetrics & Gynecology | Admitting: Obstetrics & Gynecology

## 2019-04-25 DIAGNOSIS — O99013 Anemia complicating pregnancy, third trimester: Secondary | ICD-10-CM | POA: Insufficient documentation

## 2019-04-25 DIAGNOSIS — Z3A33 33 weeks gestation of pregnancy: Secondary | ICD-10-CM | POA: Diagnosis not present

## 2019-04-25 DIAGNOSIS — D509 Iron deficiency anemia, unspecified: Secondary | ICD-10-CM | POA: Insufficient documentation

## 2019-04-25 DIAGNOSIS — Z3689 Encounter for other specified antenatal screening: Secondary | ICD-10-CM

## 2019-04-25 DIAGNOSIS — O4703 False labor before 37 completed weeks of gestation, third trimester: Secondary | ICD-10-CM | POA: Diagnosis not present

## 2019-04-25 DIAGNOSIS — Z87891 Personal history of nicotine dependence: Secondary | ICD-10-CM | POA: Diagnosis not present

## 2019-04-25 DIAGNOSIS — E509 Vitamin A deficiency, unspecified: Secondary | ICD-10-CM

## 2019-04-25 LAB — URINALYSIS, ROUTINE W REFLEX MICROSCOPIC
Bilirubin Urine: NEGATIVE
Glucose, UA: NEGATIVE mg/dL
Hgb urine dipstick: NEGATIVE
Ketones, ur: NEGATIVE mg/dL
Leukocytes,Ua: NEGATIVE
Nitrite: NEGATIVE
Protein, ur: NEGATIVE mg/dL
Specific Gravity, Urine: 1.005 (ref 1.005–1.030)
pH: 7 (ref 5.0–8.0)

## 2019-04-25 LAB — WET PREP, GENITAL
Sperm: NONE SEEN
Trich, Wet Prep: NONE SEEN
Yeast Wet Prep HPF POC: NONE SEEN

## 2019-04-25 MED ORDER — TERBUTALINE SULFATE 1 MG/ML IJ SOLN
0.2500 mg | Freq: Once | INTRAMUSCULAR | Status: AC
  Administered 2019-04-25: 0.25 mg via SUBCUTANEOUS
  Filled 2019-04-25: qty 1

## 2019-04-25 MED ORDER — LACTATED RINGERS IV BOLUS
1000.0000 mL | Freq: Once | INTRAVENOUS | Status: AC
  Administered 2019-04-25: 21:00:00 1000 mL via INTRAVENOUS

## 2019-04-25 MED ORDER — NIFEDIPINE 10 MG PO CAPS
10.0000 mg | ORAL_CAPSULE | ORAL | Status: DC
  Filled 2019-04-25: qty 1

## 2019-04-25 NOTE — MAU Provider Note (Signed)
History     CSN: [id number]  Arrival date and time: 04/25/19 2001   First Provider Initiated Contact with Patient 04/25/19 2058      Chief Complaint  Patient presents with  . Contractions   24 y.o. G2P1001 @33 .6 wks presenting with ctx. Reports onset around 6pm. Frequency is q1-2 min. Denies VB, LOF, or vaginal discharge. Denies urinary sx except urgency. Reports good FM. Had IC last night. Admits to a small amount of water and cranberry juice tonight and iced tea earlier.    OB History    Gravida  2   Para  1   Term  1   Preterm      AB      Living  1     SAB      TAB      Ectopic      Multiple  0   Live Births  1           Past Medical History:  Diagnosis Date  . Asthma    as a child.   . Migraine     Past Surgical History:  Procedure Laterality Date  . NO PAST SURGERIES      Family History  Problem Relation Age of Onset  . Cancer Neg Hx   . Diabetes Neg Hx   . Heart disease Neg Hx   . Stroke Neg Hx     Social History   Tobacco Use  . Smoking status: Former Smoker    Types: Cigars  . Smokeless tobacco: Never Used  . Tobacco comment: quit with +UPT  Substance Use Topics  . Alcohol use: No  . Drug use: No    Allergies: No Known Allergies  Medications Prior to Admission  Medication Sig Dispense Refill Last Dose  . acetaminophen (TYLENOL) 500 MG tablet Take 1,000 mg by mouth every 6 (six) hours as needed for moderate pain.   04/24/2019 at Unknown time  . diphenhydrAMINE (BENADRYL) 25 MG tablet Take 25 mg by mouth every 6 (six) hours as needed for allergies.   Past Week at Unknown time  . docusate sodium (COLACE) 100 MG capsule Take 1 capsule (100 mg total) by mouth 2 (two) times daily. 60 capsule 1 04/24/2019 at Unknown time  . ferrous gluconate (FERGON) 324 MG tablet Take 1 tablet (324 mg total) by mouth daily with breakfast. 60 tablet 1 04/24/2019 at Unknown time  . Doxylamine-Pyridoxine (DICLEGIS) 10-10 MG TBEC Take 2 tablets at  bedtime and one in the morning and one in the afternoon as needed for nausea. (Patient not taking: Reported on 04/22/2019) 60 tablet 2 Unknown at Unknown time  . polyethylene glycol powder (GLYCOLAX/MIRALAX) 17 GM/SCOOP powder Take 17 g by mouth daily as needed for moderate constipation. 500 g 2 Unknown at Unknown time  . Prenat w/o A Vit-FeFum-FePo-FA (CONCEPT OB) 130-92.4-1 MG CAPS Take 1 tablet by mouth daily. 30 capsule 12 More than a month at Unknown time  . Prenatal Vit-Fe Phos-FA-Omega (VITAFOL GUMMIES) 3.33-0.333-34.8 MG CHEW Chew 1 tablet by mouth daily. (Patient not taking: Reported on 04/22/2019) 90 tablet 5 More than a month at Unknown time  . promethazine (PHENERGAN) 25 MG tablet TAKE 1 TABLET (25 MG TOTAL) BY MOUTH EVERY 6 (SIX) HOURS AS NEEDED FOR NAUSEA OR VOMITING. (Patient not taking: Reported on 04/22/2019) 30 tablet 2 More than a month at Unknown time    Review of Systems  Gastrointestinal: Positive for abdominal pain (ctx).  Genitourinary: Positive for urgency. Negative  for dysuria, hematuria, vaginal bleeding and vaginal discharge.   Physical Exam   Blood pressure 105/65, pulse 86, temperature 98.7 F (37.1 C), temperature source Oral, resp. rate 18, height 5\' 3"  (1.6 m), weight 75.8 kg, last menstrual period 08/17/2018, SpO2 100 %, not currently breastfeeding.  Physical Exam  Nursing note and vitals reviewed. Constitutional: She is oriented to person, place, and time. She appears well-developed and well-nourished. No distress.  HENT:  Head: Normocephalic and atraumatic.  Cardiovascular: Normal rate.  Respiratory: Effort normal. No respiratory distress.  GI: Soft. She exhibits no distension. There is no abdominal tenderness.  gravid  Genitourinary:    Genitourinary Comments: VE: closed/thick   Musculoskeletal:        General: Normal range of motion.     Cervical back: Normal range of motion.  Neurological: She is alert and oriented to person, place, and time.  Skin:  Skin is warm and dry.  Psychiatric: She has a normal mood and affect.  EFM: 135 bpm, mod variability, + accels, no decels Toco: 3-5  Results for orders placed or performed during the hospital encounter of 04/25/19 (from the past 24 hour(s))  Urinalysis, Routine w reflex microscopic     Status: Abnormal   Collection Time: 04/25/19  8:25 PM  Result Value Ref Range   Color, Urine STRAW (A) YELLOW   APPearance CLEAR CLEAR   Specific Gravity, Urine 1.005 1.005 - 1.030   pH 7.0 5.0 - 8.0   Glucose, UA NEGATIVE NEGATIVE mg/dL   Hgb urine dipstick NEGATIVE NEGATIVE   Bilirubin Urine NEGATIVE NEGATIVE   Ketones, ur NEGATIVE NEGATIVE mg/dL   Protein, ur NEGATIVE NEGATIVE mg/dL   Nitrite NEGATIVE NEGATIVE   Leukocytes,Ua NEGATIVE NEGATIVE  Wet prep, genital     Status: Abnormal   Collection Time: 04/25/19  9:28 PM   Specimen: Vaginal  Result Value Ref Range   Yeast Wet Prep HPF POC NONE SEEN NONE SEEN   Trich, Wet Prep NONE SEEN NONE SEEN   Clue Cells Wet Prep HPF POC PRESENT (A) NONE SEEN   WBC, Wet Prep HPF POC FEW (A) NONE SEEN   Sperm NONE SEEN    MAU Course  Procedures Meds ordered this encounter  Medications  . lactated ringers bolus 1,000 mL  . DISCONTD: NIFEdipine (PROCARDIA) capsule 10 mg  . terbutaline (BRETHINE) injection 0.25 mg   MDM Labd ordered and reviewed. Procardia ordered but changed to Terbutaline d/t low norm BP. Pt feeling less ctx after meds. Cervix unchanged. Discussed importance of hydration with water, 5-6 bottles per day. Stable for discharge home.   Assessment and Plan   1. [redacted] weeks gestation of pregnancy   2. Maternal iron deficiency anemia affecting pregnancy, antepartum, third trimester   3. NST (non-stress test) reactive   4. Preterm uterine contractions, antepartum, third trimester    Discharge home Follow up at [facility]- as scheduled PTL precautions Hydrate  Allergies as of 04/25/2019   No Known Allergies     Medication List    STOP taking  these medications   Doxylamine-Pyridoxine 10-10 MG Tbec Commonly known as: Diclegis   promethazine 25 MG tablet Commonly known as: PHENERGAN   Vitafol Gummies 3.33-0.333-34.8 MG Chew     TAKE these medications   acetaminophen 500 MG tablet Commonly known as: TYLENOL Take 1,000 mg by mouth every 6 (six) hours as needed for moderate pain.   Concept OB 130-92.4-1 MG Caps Take 1 tablet by mouth daily.   diphenhydrAMINE 25 MG tablet  Commonly known as: BENADRYL Take 25 mg by mouth every 6 (six) hours as needed for allergies.   docusate sodium 100 MG capsule Commonly known as: Colace Take 1 capsule (100 mg total) by mouth 2 (two) times daily.   ferrous gluconate 324 MG tablet Commonly known as: FERGON Take 1 tablet (324 mg total) by mouth daily with breakfast.   polyethylene glycol powder 17 GM/SCOOP powder Commonly known as: GLYCOLAX/MIRALAX Take 17 g by mouth daily as needed for moderate constipation.       Donette Larry, CNM 04/25/2019, 11:07 PM

## 2019-04-25 NOTE — MAU Note (Signed)
Contractions started 2 hours ago.  No leaking. No bleeding. Baby moving well.

## 2019-04-25 NOTE — Discharge Instructions (Signed)
Braxton Hicks Contractions °Contractions of the uterus can occur throughout pregnancy, but they are not always a sign that you are in labor. You may have practice contractions called Braxton Hicks contractions. These false labor contractions are sometimes confused with true labor. °What are Braxton Hicks contractions? °Braxton Hicks contractions are tightening movements that occur in the muscles of the uterus before labor. Unlike true labor contractions, these contractions do not result in opening (dilation) and thinning of the cervix. Toward the end of pregnancy (32-34 weeks), Braxton Hicks contractions can happen more often and may become stronger. These contractions are sometimes difficult to tell apart from true labor because they can be very uncomfortable. You should not feel embarrassed if you go to the hospital with false labor. °Sometimes, the only way to tell if you are in true labor is for your health care provider to look for changes in the cervix. The health care provider will do a physical exam and may monitor your contractions. If you are not in true labor, the exam should show that your cervix is not dilating and your water has not broken. °If there are no other health problems associated with your pregnancy, it is completely safe for you to be sent home with false labor. You may continue to have Braxton Hicks contractions until you go into true labor. °How to tell the difference between true labor and false labor °True labor °· Contractions last 30-70 seconds. °· Contractions become very regular. °· Discomfort is usually felt in the top of the uterus, and it spreads to the lower abdomen and low back. °· Contractions do not go away with walking. °· Contractions usually become more intense and increase in frequency. °· The cervix dilates and gets thinner. °False labor °· Contractions are usually shorter and not as strong as true labor contractions. °· Contractions are usually irregular. °· Contractions  are often felt in the front of the lower abdomen and in the groin. °· Contractions may go away when you walk around or change positions while lying down. °· Contractions get weaker and are shorter-lasting as time goes on. °· The cervix usually does not dilate or become thin. °Follow these instructions at home: ° °· Take over-the-counter and prescription medicines only as told by your health care provider. °· Keep up with your usual exercises and follow other instructions from your health care provider. °· Eat and drink lightly if you think you are going into labor. °· If Braxton Hicks contractions are making you uncomfortable: °? Change your position from lying down or resting to walking, or change from walking to resting. °? Sit and rest in a tub of warm water. °? Drink enough fluid to keep your urine pale yellow. Dehydration may cause these contractions. °? Do slow and deep breathing several times an hour. °· Keep all follow-up prenatal visits as told by your health care provider. This is important. °Contact a health care provider if: °· You have a fever. °· You have continuous pain in your abdomen. °Get help right away if: °· Your contractions become stronger, more regular, and closer together. °· You have fluid leaking or gushing from your vagina. °· You pass blood-tinged mucus (bloody show). °· You have bleeding from your vagina. °· You have low back pain that you never had before. °· You feel your baby’s head pushing down and causing pelvic pressure. °· Your baby is not moving inside you as much as it used to. °Summary °· Contractions that occur before labor are   called Braxton Hicks contractions, false labor, or practice contractions. °· Braxton Hicks contractions are usually shorter, weaker, farther apart, and less regular than true labor contractions. True labor contractions usually become progressively stronger and regular, and they become more frequent. °· Manage discomfort from Braxton Hicks contractions  by changing position, resting in a warm bath, drinking plenty of water, or practicing deep breathing. °This information is not intended to replace advice given to you by your health care provider. Make sure you discuss any questions you have with your health care provider. °Document Revised: 12/13/2016 Document Reviewed: 05/16/2016 °Elsevier Patient Education © 2020 [employer] ° °

## 2019-04-26 LAB — GC/CHLAMYDIA PROBE AMP (~~LOC~~) NOT AT ARMC
Chlamydia: NEGATIVE
Comment: NEGATIVE
Comment: NORMAL
Neisseria Gonorrhea: NEGATIVE

## 2019-04-29 ENCOUNTER — Inpatient Hospital Stay (HOSPITAL_COMMUNITY): Admission: RE | Admit: 2019-04-29 | Payer: Managed Care, Other (non HMO) | Source: Ambulatory Visit

## 2019-05-04 ENCOUNTER — Encounter: Payer: Self-pay | Admitting: Obstetrics and Gynecology

## 2019-05-04 ENCOUNTER — Encounter: Payer: Managed Care, Other (non HMO) | Admitting: Obstetrics and Gynecology

## 2019-05-05 NOTE — Progress Notes (Signed)
Patient did not keep her OB appointment for 05/04/2019.  Cornelia Copa MD Attending Center for [employer] [facility])

## 2019-05-10 ENCOUNTER — Ambulatory Visit (INDEPENDENT_AMBULATORY_CARE_PROVIDER_SITE_OTHER): Payer: Managed Care, Other (non HMO) | Admitting: Obstetrics and Gynecology

## 2019-05-10 ENCOUNTER — Other Ambulatory Visit: Payer: Self-pay

## 2019-05-10 VITALS — BP 109/72 | HR 79 | Wt 172.0 lb

## 2019-05-10 DIAGNOSIS — D509 Iron deficiency anemia, unspecified: Secondary | ICD-10-CM

## 2019-05-10 DIAGNOSIS — O99013 Anemia complicating pregnancy, third trimester: Secondary | ICD-10-CM

## 2019-05-10 DIAGNOSIS — O26843 Uterine size-date discrepancy, third trimester: Secondary | ICD-10-CM

## 2019-05-10 DIAGNOSIS — Z3A36 36 weeks gestation of pregnancy: Secondary | ICD-10-CM | POA: Diagnosis not present

## 2019-05-10 DIAGNOSIS — Z3480 Encounter for supervision of other normal pregnancy, unspecified trimester: Secondary | ICD-10-CM

## 2019-05-10 HISTORY — DX: Uterine size-date discrepancy, third trimester: O26.843

## 2019-05-10 NOTE — Progress Notes (Signed)
Prenatal Visit Note Date: 05/10/2019 Clinic: [facility]  Subjective:  Samantha Knight is a 24 y.o. G2P1001 at [redacted]w[redacted]d being seen today for ongoing prenatal care.  She is currently monitored for the following issues for this low-risk pregnancy and has BMI 30.0-30.9,adult; Skin lesion; Supervision of other normal pregnancy, antepartum; Maternal iron deficiency anemia affecting pregnancy, antepartum, third trimester; and Fundal height low for dates in third trimester on their problem list.  Patient reports no complaints.   Contractions: Not present. Vag. Bleeding: None.  Movement: Present. Denies leaking of fluid.   The following portions of the patient's history were reviewed and updated as appropriate: allergies, current medications, past family history, past medical history, past social history, past surgical history and problem list. Problem list updated.  Objective:   Vitals:   05/10/19 1358  BP: 109/72  Pulse: 79  Weight: 172 lb (78 kg)    Fetal Status: Fetal Heart Rate (bpm): 138 Fundal Height: 31 cm Movement: Present     General:  Alert, oriented and cooperative. Patient is in no acute distress.  Skin: Skin is warm and dry. No rash noted.   Cardiovascular: Normal heart rate noted  Respiratory: Normal respiratory effort, no problems with respiration noted  Abdomen: Soft, gravid, appropriate for gestational age. Pain/Pressure: Present     Pelvic:  Cervical exam deferred        Extremities: Normal range of motion.  Edema: None  Mental Status: Normal mood and affect. Normal behavior. Normal judgment and thought content.   Urinalysis:      Assessment and Plan:  Pregnancy: G2P1001 at [redacted]w[redacted]d  1. Supervision of other normal pregnancy, antepartum Pt defers GBS exam for today - [place] MFM OB FOLLOW UP; Future  2. Maternal iron deficiency anemia affecting pregnancy, antepartum, third trimester Continue iron  3. Fundal height low for dates in third trimester [facility]  precautions given NST today reactive, 135 baseline, +accels, no decel, mod variablity, occasional UCs.  Growth u/s ordered - [place] MFM OB FOLLOW UP; Future  Preterm labor symptoms and general obstetric precautions including but not limited to vaginal bleeding, contractions, leaking of fluid and fetal movement were reviewed in detail with the patient. Please refer to After Visit Summary for other counseling recommendations.  Return in about 1 week (around 05/17/2019) for low risk, in person.   [place] Bing, MD

## 2019-05-11 ENCOUNTER — Encounter (HOSPITAL_COMMUNITY): Payer: Self-pay

## 2019-05-11 ENCOUNTER — Ambulatory Visit (HOSPITAL_COMMUNITY)
Admission: RE | Admit: 2019-05-11 | Discharge: 2019-05-11 | Disposition: A | Discharge status: Home / Self Care | Payer: Managed Care, Other (non HMO) | Source: Ambulatory Visit | Attending: Obstetrics and Gynecology | Admitting: Obstetrics and Gynecology

## 2019-05-11 ENCOUNTER — Ambulatory Visit (HOSPITAL_COMMUNITY): Payer: Managed Care, Other (non HMO) | Admitting: *Deleted

## 2019-05-11 ENCOUNTER — Other Ambulatory Visit: Payer: Self-pay

## 2019-05-11 VITALS — BP 106/68 | HR 97 | Temp 97.6°F

## 2019-05-11 DIAGNOSIS — O26843 Uterine size-date discrepancy, third trimester: Secondary | ICD-10-CM | POA: Diagnosis present

## 2019-05-11 DIAGNOSIS — O99013 Anemia complicating pregnancy, third trimester: Secondary | ICD-10-CM | POA: Insufficient documentation

## 2019-05-11 DIAGNOSIS — D509 Iron deficiency anemia, unspecified: Secondary | ICD-10-CM

## 2019-05-11 DIAGNOSIS — Z3480 Encounter for supervision of other normal pregnancy, unspecified trimester: Secondary | ICD-10-CM

## 2019-05-11 DIAGNOSIS — Z362 Encounter for other antenatal screening follow-up: Secondary | ICD-10-CM

## 2019-05-11 DIAGNOSIS — Z3A36 36 weeks gestation of pregnancy: Secondary | ICD-10-CM | POA: Diagnosis not present

## 2019-05-17 ENCOUNTER — Ambulatory Visit (INDEPENDENT_AMBULATORY_CARE_PROVIDER_SITE_OTHER): Payer: Managed Care, Other (non HMO) | Admitting: Obstetrics and Gynecology

## 2019-05-17 ENCOUNTER — Other Ambulatory Visit: Payer: Self-pay

## 2019-05-17 VITALS — BP 107/67 | HR 96 | Wt 173.0 lb

## 2019-05-17 DIAGNOSIS — Z3480 Encounter for supervision of other normal pregnancy, unspecified trimester: Secondary | ICD-10-CM

## 2019-05-17 NOTE — Progress Notes (Signed)
Prenatal Visit Note Date: 05/17/2019 Clinic: [facility]  Subjective:  Samantha Knight is a 24 y.o. G2P1001 at [redacted]w[redacted]d being seen today for ongoing prenatal care.  She is currently monitored for the following issues for this low-risk pregnancy and has BMI 30.0-30.9,adult; Skin lesion; Supervision of other normal pregnancy, antepartum; Maternal iron deficiency anemia affecting pregnancy, antepartum, third trimester; and Fundal height low for dates in third trimester on their problem list.  Patient reports occasional contractions.   Contractions: Not present. Vag. Bleeding: None.  Movement: Present. Denies leaking of fluid.   The following portions of the patient's history were reviewed and updated as appropriate: allergies, current medications, past family history, past medical history, past social history, past surgical history and problem list. Problem list updated.  Objective:   Vitals:   05/17/19 1501  BP: 107/67  Pulse: 96  Weight: 173 lb (78.5 kg)    Fetal Status: Fetal Heart Rate (bpm): 135 Fundal Height: 33 cm Movement: Present  Presentation: Vertex  General:  Alert, oriented and cooperative. Patient is in no acute distress.  Skin: Skin is warm and dry. No rash noted.   Cardiovascular: Normal heart rate noted  Respiratory: Normal respiratory effort, no problems with respiration noted  Abdomen: Soft, gravid, appropriate for gestational age. Pain/Pressure: Absent     Pelvic:  Cervical exam performed Dilation: 1.5 Effacement (%): 50 Station: -2  Extremities: Normal range of motion.  Edema: None  Mental Status: Normal mood and affect. Normal behavior. Normal judgment and thought content.   Urinalysis:      Assessment and Plan:  Pregnancy: G2P1001 at [redacted]w[redacted]d  1. Supervision of other normal pregnancy, antepartum Routine care. Fundal heigh still low but normal growth last week. Rpt 3-4wks from last week - Strep Gp B NAA  Term labor symptoms and general  obstetric precautions including but not limited to vaginal bleeding, contractions, leaking of fluid and fetal movement were reviewed in detail with the patient. Please refer to After Visit Summary for other counseling recommendations.  Return for 7-10d rob in person.   Ray Bing, MD

## 2019-05-19 LAB — STREP GP B NAA: Strep Gp B NAA: NEGATIVE

## 2019-05-20 ENCOUNTER — Telehealth: Payer: Self-pay | Admitting: *Deleted

## 2019-05-20 ENCOUNTER — Ambulatory Visit (INDEPENDENT_AMBULATORY_CARE_PROVIDER_SITE_OTHER): Payer: Managed Care, Other (non HMO) | Admitting: *Deleted

## 2019-05-20 ENCOUNTER — Other Ambulatory Visit: Payer: Self-pay

## 2019-05-20 DIAGNOSIS — O99013 Anemia complicating pregnancy, third trimester: Secondary | ICD-10-CM | POA: Diagnosis not present

## 2019-05-20 DIAGNOSIS — D509 Iron deficiency anemia, unspecified: Secondary | ICD-10-CM

## 2019-05-20 NOTE — Telephone Encounter (Signed)
Called pt to come in for NST.

## 2019-05-20 NOTE — Progress Notes (Signed)
Patient seen and assessed by nursing staff during this encounter. I have reviewed the chart and agree with the documentation and plan. NST performed today was reviewed and was found to be reactive.  Continue recommended prenatal care.   Jaynie Collins, MD 05/20/2019 4:08 PM

## 2019-05-20 NOTE — Progress Notes (Signed)
Pt here today due to having decreased fetal movement. Placed on fetal monitor for NST.  NST in office reassuring. Fetal kick counts discussed. Pt to call office if any issues.   Scheryl Marten, RN

## 2019-05-26 ENCOUNTER — Other Ambulatory Visit: Payer: Self-pay

## 2019-05-26 ENCOUNTER — Ambulatory Visit (INDEPENDENT_AMBULATORY_CARE_PROVIDER_SITE_OTHER): Payer: Managed Care, Other (non HMO) | Admitting: Advanced Practice Midwife

## 2019-05-26 VITALS — BP 111/75 | HR 106 | Wt 176.0 lb

## 2019-05-26 DIAGNOSIS — O26843 Uterine size-date discrepancy, third trimester: Secondary | ICD-10-CM

## 2019-05-26 DIAGNOSIS — Z3493 Encounter for supervision of normal pregnancy, unspecified, third trimester: Secondary | ICD-10-CM

## 2019-05-26 NOTE — Progress Notes (Signed)
   PRENATAL VISIT NOTE  Subjective:  Samantha Knight is a 24 y.o. G2P1001 at [redacted]w[redacted]d being seen today for ongoing prenatal care.  She is currently monitored for the following issues for this low-risk pregnancy and has BMI 30.0-30.9,adult; Skin lesion; Supervision of other normal pregnancy, antepartum; Maternal iron deficiency anemia affecting pregnancy, antepartum, third trimester; and Fundal height low for dates in third trimester on their problem list.  Patient reports no complaints. Requests membrane sweep today.  Contractions: Irregular. Vag. Bleeding: None.  Movement: Present. Denies leaking of fluid.   The following portions of the patient's history were reviewed and updated as appropriate: allergies, current medications, past family history, past medical history, past social history, past surgical history and problem list. Problem list updated.  Objective:   Vitals:   05/26/19 1429  BP: 111/75  Pulse: (!) 106  Weight: 176 lb (79.8 kg)    Fetal Status: Fetal Heart Rate (bpm): 140 Fundal Height: 34 cm Movement: Present  Presentation: Vertex  General:  Alert, oriented and cooperative. Patient is in no acute distress.  Skin: Skin is warm and dry. No rash noted.   Cardiovascular: Normal heart rate noted  Respiratory: Normal respiratory effort, no problems with respiration noted  Abdomen: Soft, gravid, appropriate for gestational age.  Pain/Pressure: Present     Pelvic: Cervical exam performed Dilation: 1.5 Effacement (%): Thick Station: -3  Extremities: Normal range of motion.  Edema: Trace  Mental Status: Normal mood and affect. Normal behavior. Normal judgment and thought content.   Assessment and Plan:  Pregnancy: G2P1001 at [redacted]w[redacted]d  1. Encounter for supervision of low-risk pregnancy in third trimester --Discussed membrane sweeping today. Reviewed Cochrane Review data on membrane sweeping at 39 wks and then at EDD. Reviewed risk of cramping, contractions, bleeding and ROM.  Answered patient questions, plan to perform next visit  --Schedule IOL next visit   2. Fundal height low for dates in third trimester - S/p reassuring MFM scan 05/11/2019. EFW 2819g at 36 weeks - S<D today, consistent with previous - Discussed with Dr. Shawnie Pons, no change to plan of care indicated. Patient notified by phone after appt that no changes to plan were indicated.  Term labor symptoms and general obstetric precautions including but not limited to vaginal bleeding, contractions, leaking of fluid and fetal movement were reviewed in detail with the patient. Please refer to After Visit Summary for other counseling recommendations.  Return in about 1 week (around 06/02/2019).  Future Appointments  Date Time Provider Department Center  06/02/2019  1:30 PM Federico Flake, MD [facility]    [place], [place]

## 2019-05-26 NOTE — Patient Instructions (Signed)

## 2019-05-28 ENCOUNTER — Other Ambulatory Visit: Payer: Self-pay

## 2019-05-28 ENCOUNTER — Encounter (HOSPITAL_COMMUNITY): Payer: Self-pay | Admitting: Family Medicine

## 2019-05-28 ENCOUNTER — Inpatient Hospital Stay (HOSPITAL_COMMUNITY)
Admission: AD | Admit: 2019-05-28 | Discharge: 2019-05-30 | DRG: 807 | Disposition: A | Discharge status: Home / Self Care | Payer: Managed Care, Other (non HMO) | Attending: Family Medicine | Admitting: Family Medicine

## 2019-05-28 DIAGNOSIS — O99214 Obesity complicating childbirth: Secondary | ICD-10-CM | POA: Diagnosis present

## 2019-05-28 DIAGNOSIS — O26843 Uterine size-date discrepancy, third trimester: Secondary | ICD-10-CM | POA: Diagnosis present

## 2019-05-28 DIAGNOSIS — Z3A38 38 weeks gestation of pregnancy: Secondary | ICD-10-CM

## 2019-05-28 DIAGNOSIS — O9902 Anemia complicating childbirth: Principal | ICD-10-CM | POA: Diagnosis present

## 2019-05-28 DIAGNOSIS — O26893 Other specified pregnancy related conditions, third trimester: Secondary | ICD-10-CM | POA: Diagnosis present

## 2019-05-28 DIAGNOSIS — Z87891 Personal history of nicotine dependence: Secondary | ICD-10-CM | POA: Diagnosis not present

## 2019-05-28 DIAGNOSIS — Z20822 Contact with and (suspected) exposure to covid-19: Secondary | ICD-10-CM | POA: Diagnosis present

## 2019-05-28 DIAGNOSIS — E669 Obesity, unspecified: Secondary | ICD-10-CM | POA: Diagnosis present

## 2019-05-28 DIAGNOSIS — D509 Iron deficiency anemia, unspecified: Secondary | ICD-10-CM | POA: Diagnosis present

## 2019-05-28 DIAGNOSIS — O99013 Anemia complicating pregnancy, third trimester: Secondary | ICD-10-CM | POA: Diagnosis present

## 2019-05-28 LAB — TYPE AND SCREEN
ABO/RH(D): A POS
Antibody Screen: NEGATIVE

## 2019-05-28 LAB — ABO/RH: ABO/RH(D): A POS

## 2019-05-28 LAB — SARS CORONAVIRUS 2 BY RT PCR (HOSPITAL ORDER, PERFORMED IN ~~LOC~~ HOSPITAL LAB): SARS Coronavirus 2: NEGATIVE

## 2019-05-28 MED ORDER — OXYTOCIN 40 UNITS IN NORMAL SALINE INFUSION - SIMPLE MED
2.5000 [IU]/h | INTRAVENOUS | Status: DC
  Filled 2019-05-28: qty 1000

## 2019-05-28 MED ORDER — SOD CITRATE-CITRIC ACID 500-334 MG/5ML PO SOLN: 30.0000 mL | ORAL | Status: DC | PRN

## 2019-05-28 MED ORDER — LIDOCAINE HCL (PF) 1 % IJ SOLN: 30.0000 mL | INTRAMUSCULAR | Status: DC | PRN

## 2019-05-28 MED ORDER — LACTATED RINGERS IV SOLN: INTRAVENOUS | Status: DC

## 2019-05-28 MED ORDER — ACETAMINOPHEN 325 MG PO TABS: 650.0000 mg | ORAL_TABLET | ORAL | Status: DC | PRN

## 2019-05-28 MED ORDER — OXYTOCIN BOLUS FROM INFUSION
500.0000 mL | Freq: Once | INTRAVENOUS | Status: AC
  Administered 2019-05-29: 500 mL via INTRAVENOUS

## 2019-05-28 MED ORDER — FENTANYL CITRATE (PF) 100 MCG/2ML IJ SOLN
100.0000 ug | INTRAMUSCULAR | Status: DC | PRN
  Administered 2019-05-28: 100 ug via INTRAVENOUS

## 2019-05-28 MED ORDER — FENTANYL CITRATE (PF) 100 MCG/2ML IJ SOLN
INTRAMUSCULAR | Status: AC
  Filled 2019-05-28: qty 2

## 2019-05-28 MED ORDER — FLEET ENEMA 7-19 GM/118ML RE ENEM: 1.0000 | ENEMA | RECTAL | Status: DC | PRN

## 2019-05-28 MED ORDER — ONDANSETRON HCL 4 MG/2ML IJ SOLN: 4.0000 mg | Freq: Four times a day (QID) | INTRAMUSCULAR | Status: DC | PRN

## 2019-05-28 MED ORDER — LACTATED RINGERS IV SOLN: 500.0000 mL | INTRAVENOUS | Status: DC | PRN

## 2019-05-28 MED ORDER — OXYCODONE-ACETAMINOPHEN 5-325 MG PO TABS: 1.0000 | ORAL_TABLET | ORAL | Status: DC | PRN

## 2019-05-28 MED ORDER — OXYCODONE-ACETAMINOPHEN 5-325 MG PO TABS: 2.0000 | ORAL_TABLET | ORAL | Status: DC | PRN

## 2019-05-28 NOTE — MAU Note (Signed)
Pt reports contractions since 2am, now every 5-6 mins. Denies LOF or vaginal bleeding. Reports good fetal movement. Cervix 1.5cm on last exam.

## 2019-05-28 NOTE — H&P (Signed)
Samantha Knight is a 24 y.o. female G2P1001 @ 38.4wks by 6wk u/s presenting for reg ctx/early labor. Her preg has been followed by the [facility] office since 9wks and has been remarkable for:  # uterine S<D with EFW 47th% # anemia (9.7 in 3rd tri with po Fe) # GBS neg   OB History    Gravida  2   Para  1   Term  1   Preterm      AB      Living  1     SAB      TAB      Ectopic      Multiple  0   Live Births  1          Past Medical History:  Diagnosis Date  . Asthma    as a child.   . Migraine    Past Surgical History:  Procedure Laterality Date  . NO PAST SURGERIES     Family History: family history is not on file. Social History:  reports that she has quit smoking. Her smoking use included cigars. She has never used smokeless tobacco. She reports that she does not drink alcohol or use drugs.     Maternal Diabetes: No Genetic Screening: Declined Maternal Ultrasounds/Referrals: Normal Fetal Ultrasounds or other Referrals:  None Maternal Substance Abuse:  No Significant Maternal Medications:  None Significant Maternal Lab Results:  Group B Strep negative Other Comments:  None  Review of Systems History Dilation: 5.5 Effacement (%): 80 Station: -2 Exam by:: Camelia Eng, RN Blood pressure 107/72, pulse (!) 114, resp. rate 18, last menstrual period 08/17/2018, not currently breastfeeding. Exam Physical Exam  Constitutional: She is oriented to person, place, and time. She appears well-developed.  HENT:  Head: Normocephalic.  Cardiovascular:  Sl tachycardic  Respiratory: Effort normal.  GI:  EFM 140s, +accels, no decels, occ variables Ctx q 5 mins  Musculoskeletal:        General: Normal range of motion.     Cervical back: Normal range of motion.  Neurological: She is alert and oriented to person, place, and time.  Skin: Skin is warm and dry.  Psychiatric: She has a normal mood and affect. Her behavior is normal. Thought content  normal.    Prenatal labs: ABO, Rh: A/Positive/-- (11/03 1452) Antibody: Negative (11/03 1452) Rubella: 3.05 (11/03 1452) RPR: Non Reactive (02/23 0920)  HBsAg: Negative (11/03 1452)  HIV: Non Reactive (02/23 0915)  GBS: Negative/-- (05/03 1541)   Assessment/Plan: IUP@term  Early active labor GBS neg  Admit to Labor and Delivery Expectant management for now Anticipate vag del   Arabella Merles CNM 05/28/2019, 11:17 PM

## 2019-05-29 ENCOUNTER — Inpatient Hospital Stay (HOSPITAL_COMMUNITY): Payer: Managed Care, Other (non HMO) | Admitting: Anesthesiology

## 2019-05-29 ENCOUNTER — Encounter (HOSPITAL_COMMUNITY): Payer: Self-pay | Admitting: Family Medicine

## 2019-05-29 DIAGNOSIS — Z3A38 38 weeks gestation of pregnancy: Secondary | ICD-10-CM

## 2019-05-29 LAB — RPR: RPR Ser Ql: NONREACTIVE

## 2019-05-29 LAB — CBC
HCT: 32.6 % — ABNORMAL LOW (ref 36.0–46.0)
Hemoglobin: 10.2 g/dL — ABNORMAL LOW (ref 12.0–15.0)
MCH: 28.5 pg (ref 26.0–34.0)
MCHC: 31.3 g/dL (ref 30.0–36.0)
MCV: 91.1 fL (ref 80.0–100.0)
Platelets: 214 10*3/uL (ref 150–400)
RBC: 3.58 MIL/uL — ABNORMAL LOW (ref 3.87–5.11)
RDW: 13.5 % (ref 11.5–15.5)
WBC: 10.3 10*3/uL (ref 4.0–10.5)
nRBC: 0 % (ref 0.0–0.2)

## 2019-05-29 MED ORDER — DIPHENHYDRAMINE HCL 50 MG/ML IJ SOLN: 12.5000 mg | INTRAMUSCULAR | Status: DC | PRN

## 2019-05-29 MED ORDER — SENNOSIDES-DOCUSATE SODIUM 8.6-50 MG PO TABS
2.0000 | ORAL_TABLET | ORAL | Status: DC
  Administered 2019-05-30: 2 via ORAL
  Filled 2019-05-29: qty 2

## 2019-05-29 MED ORDER — MEASLES, MUMPS & RUBELLA VAC IJ SOLR: 0.5000 mL | Freq: Once | INTRAMUSCULAR | Status: DC

## 2019-05-29 MED ORDER — PRENATAL MULTIVITAMIN CH
1.0000 | ORAL_TABLET | Freq: Every day | ORAL | Status: DC
  Administered 2019-05-29: 1 via ORAL
  Filled 2019-05-29: qty 1

## 2019-05-29 MED ORDER — TETANUS-DIPHTH-ACELL PERTUSSIS 5-2.5-18.5 LF-MCG/0.5 IM SUSP: 0.5000 mL | Freq: Once | INTRAMUSCULAR | Status: DC

## 2019-05-29 MED ORDER — WITCH HAZEL-GLYCERIN EX PADS: 1.0000 "application " | MEDICATED_PAD | CUTANEOUS | Status: DC | PRN

## 2019-05-29 MED ORDER — SIMETHICONE 80 MG PO CHEW: 80.0000 mg | CHEWABLE_TABLET | ORAL | Status: DC | PRN

## 2019-05-29 MED ORDER — ONDANSETRON HCL 4 MG/2ML IJ SOLN: 4.0000 mg | INTRAMUSCULAR | Status: DC | PRN

## 2019-05-29 MED ORDER — EPHEDRINE 5 MG/ML INJ: 10.0000 mg | INTRAVENOUS | Status: DC | PRN

## 2019-05-29 MED ORDER — ACETAMINOPHEN 325 MG PO TABS
650.0000 mg | ORAL_TABLET | ORAL | Status: DC | PRN
  Administered 2019-05-29 (×2): 650 mg via ORAL
  Filled 2019-05-29 (×2): qty 2

## 2019-05-29 MED ORDER — COCONUT OIL OIL: 1.0000 "application " | TOPICAL_OIL | Status: DC | PRN

## 2019-05-29 MED ORDER — FENTANYL-BUPIVACAINE-NACL 0.5-0.125-0.9 MG/250ML-% EP SOLN
EPIDURAL | Status: AC
  Filled 2019-05-29: qty 250

## 2019-05-29 MED ORDER — OXYCODONE HCL 5 MG PO TABS
5.0000 mg | ORAL_TABLET | ORAL | Status: DC | PRN
  Administered 2019-05-29 (×4): 5 mg via ORAL
  Filled 2019-05-29 (×5): qty 1

## 2019-05-29 MED ORDER — DIPHENHYDRAMINE HCL 25 MG PO CAPS: 25.0000 mg | ORAL_CAPSULE | Freq: Four times a day (QID) | ORAL | Status: DC | PRN

## 2019-05-29 MED ORDER — ZOLPIDEM TARTRATE 5 MG PO TABS: 5.0000 mg | ORAL_TABLET | Freq: Every evening | ORAL | Status: DC | PRN

## 2019-05-29 MED ORDER — IBUPROFEN 600 MG PO TABS
600.0000 mg | ORAL_TABLET | Freq: Four times a day (QID) | ORAL | Status: DC
  Administered 2019-05-29 – 2019-05-30 (×4): 600 mg via ORAL
  Filled 2019-05-29 (×4): qty 1

## 2019-05-29 MED ORDER — DIBUCAINE (PERIANAL) 1 % EX OINT: 1.0000 "application " | TOPICAL_OINTMENT | CUTANEOUS | Status: DC | PRN

## 2019-05-29 MED ORDER — PHENYLEPHRINE 40 MCG/ML (10ML) SYRINGE FOR IV PUSH (FOR BLOOD PRESSURE SUPPORT): 80.0000 ug | PREFILLED_SYRINGE | INTRAVENOUS | Status: DC | PRN

## 2019-05-29 MED ORDER — FENTANYL-BUPIVACAINE-NACL 0.5-0.125-0.9 MG/250ML-% EP SOLN: 12.0000 mL/h | EPIDURAL | Status: DC | PRN

## 2019-05-29 MED ORDER — ONDANSETRON HCL 4 MG PO TABS: 4.0000 mg | ORAL_TABLET | ORAL | Status: DC | PRN

## 2019-05-29 MED ORDER — SODIUM CHLORIDE (PF) 0.9 % IJ SOLN
INTRAMUSCULAR | Status: DC | PRN
  Administered 2019-05-29: 12 mL/h via EPIDURAL

## 2019-05-29 MED ORDER — LACTATED RINGERS IV SOLN: 500.0000 mL | Freq: Once | INTRAVENOUS | Status: DC

## 2019-05-29 MED ORDER — BENZOCAINE-MENTHOL 20-0.5 % EX AERO: 1.0000 "application " | INHALATION_SPRAY | CUTANEOUS | Status: DC | PRN

## 2019-05-29 MED ORDER — LIDOCAINE HCL (PF) 1 % IJ SOLN
INTRAMUSCULAR | Status: DC | PRN
  Administered 2019-05-29 (×2): 4 mL via EPIDURAL

## 2019-05-29 MED ORDER — LACTATED RINGERS IV SOLN
500.0000 mL | Freq: Once | INTRAVENOUS | Status: AC
  Administered 2019-05-29: 500 mL via INTRAVENOUS

## 2019-05-29 NOTE — Progress Notes (Signed)
Patient ID: Samantha Knight, female   DOB: [birth year], 24 y.o.   MRN: [id number]  Comfortable w/ epidural  BP 106/68, 113/80, P 97 FHR 125, +accels, no decels, Cat 1 Ctx q 3 mins, spont Cx per RN 7+/C/vtx -2  IUP@term  Active labor GBS neg  Continue expectant management with cx check in approx 2 hrs or sooner prn Anticipate vag del  Arabella Merles CNM 05/29/2019 1:50 AM

## 2019-05-29 NOTE — Anesthesia Preprocedure Evaluation (Signed)
Anesthesia Evaluation  Patient identified by MRN, date of birth, ID band Patient awake    Reviewed: Allergy & Precautions, Patient's Chart, lab work & pertinent test results  Airway Mallampati: II  TM Distance: >3 FB Neck ROM: Full    Dental no notable dental hx. (+) Teeth Intact   Pulmonary asthma , former smoker,    Pulmonary exam normal breath sounds clear to auscultation       Cardiovascular negative cardio ROS Normal cardiovascular exam Rhythm:Regular Rate:Normal     Neuro/Psych  Headaches, negative psych ROS   GI/Hepatic Neg liver ROS, GERD  ,  Endo/Other  Obesity  Renal/GU negative Renal ROS  negative genitourinary   Musculoskeletal negative musculoskeletal ROS (+)   Abdominal (+) + obese,   Peds  Hematology  (+) anemia ,   Anesthesia Other Findings   Reproductive/Obstetrics (+) Pregnancy                             Anesthesia Physical Anesthesia Plan  ASA: II  Anesthesia Plan: Epidural   Post-op Pain Management:    Induction:   PONV Risk Score and Plan: Treatment may vary due to age or medical condition  Airway Management Planned: Natural Airway  Additional Equipment:   Intra-op Plan:   Post-operative Plan:   Informed Consent: I have reviewed the patients History and Physical, chart, labs and discussed the procedure including the risks, benefits and alternatives for the proposed anesthesia with the patient or authorized representative who has indicated his/her understanding and acceptance.       Plan Discussed with: Anesthesiologist  Anesthesia Plan Comments:         Anesthesia Quick Evaluation

## 2019-05-29 NOTE — Anesthesia Postprocedure Evaluation (Signed)
Anesthesia Post Note  Patient: Samantha Knight  Procedure(s) Performed: AN AD HOC LABOR EPIDURAL     Patient location during evaluation: Mother Baby Anesthesia Type: Epidural Level of consciousness: awake Pain management: satisfactory to patient Vital Signs Assessment: post-procedure vital signs reviewed and stable Respiratory status: spontaneous breathing Cardiovascular status: stable Anesthetic complications: no    Last Vitals:  Vitals:   05/29/19 1024 05/29/19 1504  BP: 99/66 104/71  Pulse: 84 77  Resp: 19 19  Temp: (!) 36.4 C 36.7 C  SpO2: 99% 99%    Last Pain:  Vitals:   05/29/19 1709  TempSrc:   PainSc: 0-No pain   Pain Goal:                   [employer]

## 2019-05-29 NOTE — Discharge Summary (Addendum)
Postpartum Discharge Summary    Patient Name: Samantha Knight DOB: 08/19/1995 MRN: [id number]  Date of admission: 05/28/2019 Delivery date:05/29/2019  Delivering provider: Serita Grammes D  Date of discharge: 05/30/2019  Admitting diagnosis: Normal labor [O80, Z37.9] Intrauterine pregnancy: [redacted]w[redacted]d    Secondary diagnosis:  Principal Problem:   SVD (spontaneous vaginal delivery) Active Problems:   Maternal iron deficiency anemia affecting pregnancy, antepartum, third trimester   Fundal height low for dates in third trimester  Additional problems: none    Discharge diagnosis: Term Pregnancy Delivered                                              Post partum procedures:None Augmentation: none Complications: None  Hospital course: Onset of Labor With Vaginal Delivery      24y.o. yo G2P1001 at 315w5das admitted in Active Labor on 05/28/2019. Patient had an uncomplicated labor course as follows: she progressed spontaneously to complete after receiving an epidural. She had SROM and then 30 mins later was complete and pushed well to SVD.   Membrane Rupture Time/Date: 2:50 AM ,05/29/2019   Delivery Method:Vaginal, Spontaneous  Episiotomy: None  Lacerations:  None  Patient had an uncomplicated postpartum course.  She is ambulating, tolerating a regular diet, passing flatus, and urinating well. Patient is discharged home in stable condition on 05/30/19.  Newborn Data: Birth date:05/29/2019  Birth time:3:41 AM  Gender:Female  Living status:Living  Apgars:9 ,9   Weight: 2730g (6lb 0.3oz)  Magnesium Sulfate received: No BMZ received: No Rhophylac:N/A MMR:N/A T-DaP:Given prenatally Flu: No Transfusion:No  Physical exam  Vitals:   05/29/19 1024 05/29/19 1504 05/29/19 2216 05/30/19 0546  BP: 99/66 104/71 100/71 102/73  Pulse: 84 77 80 68  Resp: '19 19 17 16  ' Temp: (!) 97.5 F (36.4 C) 98 F (36.7 C) 97.8 F (36.6 C)   TempSrc: Axillary  Oral   SpO2: 99% 99% 100% 100%    General: alert, cooperative and no distress  Chest: Lungs CTA, Heart RRR Abdomen: Soft, NonTender, Non/Distended, BS x 4Q Lochia: appropriate Uterine Fundus: firm Incision: N/A DVT Evaluation: No evidence of DVT seen on physical exam. No significant calf/ankle edema. Labs: Lab Results  Component Value Date   WBC 10.3 05/28/2019   HGB 10.2 (L) 05/28/2019   HCT 32.6 (L) 05/28/2019   MCV 91.1 05/28/2019   PLT 214 05/28/2019   CMP Latest Ref Rng & Units 04/18/2019  Glucose 70 - 99 mg/dL 106(H)  BUN 6 - 20 mg/dL 5(L)  Creatinine 0.44 - 1.00 mg/dL 0.54  Sodium 135 - 145 mmol/L 134(L)  Potassium 3.5 - 5.1 mmol/L 3.8  Chloride 98 - 111 mmol/L 106  CO2 22 - 32 mmol/L 19(L)  Calcium 8.9 - 10.3 mg/dL 8.3(L)  Total Protein 6.5 - 8.1 g/dL 6.3(L)  Total Bilirubin 0.3 - 1.2 mg/dL 0.2(L)  Alkaline Phos 38 - 126 U/L 136(H)  AST 15 - 41 U/L 22  ALT 0 - 44 U/L 18   Edinburgh Score: Edinburgh Postnatal Depression Scale Screening Tool 05/29/2019  I have been able to laugh and see the funny side of things. (No Data)  I have looked forward with enjoyment to things. -  I have blamed myself unnecessarily when things went wrong. -  I have been anxious or worried for no good reason. -  I have felt scared  or panicky for no good reason. -  Things have been getting on top of me. -  I have been so unhappy that I have had difficulty sleeping. -  I have felt sad or miserable. -  I have been so unhappy that I have been crying. -  The thought of harming myself has occurred to me. [name] Postnatal Depression Scale Total -     After visit meds:  Allergies as of 05/30/2019   No Known Allergies     Medication List    STOP taking these medications   docusate sodium 100 MG capsule Commonly known as: Colace   ferrous gluconate 324 MG tablet Commonly known as: FERGON   polyethylene glycol powder 17 GM/SCOOP powder Commonly known as: GLYCOLAX/MIRALAX     TAKE these medications    acetaminophen 500 MG tablet Commonly known as: TYLENOL Take 1,000 mg by mouth every 6 (six) hours as needed for moderate pain.   Concept OB 130-92.4-1 MG Caps Take 1 tablet by mouth daily.   diphenhydrAMINE 25 MG tablet Commonly known as: BENADRYL Take 25 mg by mouth every 6 (six) hours as needed for allergies.   ibuprofen 600 MG tablet Commonly known as: ADVIL Take 1 tablet (600 mg total) by mouth every 6 (six) hours.        Discharge home in stable condition Infant Feeding: Bottle and Breast Infant Disposition:home with mother Discharge instruction: per After Visit Summary and Postpartum booklet. Activity: Advance as tolerated. Pelvic rest for 6 weeks.  Diet: routine diet Future Appointments: Future Appointments  Date Time Provider [place]  06/02/2019  1:30 PM Caren Macadam, MD [facility]   Follow up Visit:  Please schedule this patient for Postpartum visit in: 4 weeks with the following provider: Any provider Pt preference- virtual or in person For C/S patients schedule nurse incision check in weeks 2 weeks: no Low risk pregnancy complicated by: S<D, anemia Delivery mode:  SVD Anticipated Birth Control:  other/unsure PP Procedures needed: none  Schedule Integrated [place] visit: no   05/30/2019 Maryann Conners, CNM

## 2019-05-29 NOTE — Anesthesia Procedure Notes (Signed)
Epidural Patient location during procedure: OB Start time: 05/29/2019 12:33 AM End time: 05/29/2019 12:41 AM  Staffing Anesthesiologist: Mal Amabile, MD Performed: anesthesiologist   Preanesthetic Checklist Completed: patient identified, IV checked, site marked, risks and benefits discussed, surgical consent, monitors and equipment checked, pre-op evaluation and timeout performed  Epidural Patient position: sitting Prep: DuraPrep and site prepped and draped Patient monitoring: continuous pulse ox and blood pressure Approach: midline Location: L3-L4 Injection technique: LOR air  Needle:  Needle type: Tuohy  Needle gauge: 17 G Needle length: 9 cm and 9 Needle insertion depth: 6 cm Catheter type: closed end flexible Catheter size: 19 Gauge Catheter at skin depth: 11 cm Test dose: negative and Other  Assessment Events: blood not aspirated, injection not painful, no injection resistance, no paresthesia and negative IV test  Additional Notes Patient identified. Risks and benefits discussed including failed block, incomplete  Pain control, post dural puncture headache, nerve damage, paralysis, blood pressure Changes, nausea, vomiting, reactions to medications-both toxic and allergic and post Partum back pain. All questions were answered. Patient expressed understanding and wished to proceed. Sterile technique was used throughout procedure. Epidural site was Dressed with sterile barrier dressing. No paresthesias, signs of intravascular injection Or signs of intrathecal spread were encountered.  Patient was more comfortable after the epidural was dosed. Please see RN's note for documentation of vital signs and FHR which are stable. Reason for block:procedure for pain

## 2019-05-30 MED ORDER — IBUPROFEN 600 MG PO TABS: 600.0000 mg | ORAL_TABLET | Freq: Four times a day (QID) | ORAL | 0 refills | Status: DC

## 2019-05-30 NOTE — Lactation Note (Signed)
This note was copied from a baby's chart. Lactation Consultation Note  Patient Name: Samantha Knight [id number] Date: 05/30/2019 Reason for consult: Initial assessment;1st time breastfeeding Mom called with concern about breast fullness.  Breasts filling but not engorged.  Ice packs given to mom and also instructed to lie flat on back and massage breast using oil toward armpits.  Manual pump given with instructions.  Reassured mom.  Maternal Data    Feeding Feeding Type: Breast Fed  LATCH Score Latch: Grasps breast easily, tongue down, lips flanged, rhythmical sucking.  Audible Swallowing: A few with stimulation  Type of Nipple: Everted at rest and after stimulation  Comfort (Breast/Nipple): Soft / non-tender  Hold (Positioning): Assistance needed to correctly position infant at breast and maintain latch.  LATCH Score: 8  Interventions Interventions: Breast compression;Assisted with latch;Adjust position;Skin to skin;Support pillows;Breast massage;Position options  Lactation Tools Discussed/Used     Consult Status Consult Status: Complete    Huston Foley 05/30/2019, 12:01 PM

## 2019-05-30 NOTE — Discharge Instructions (Signed)

## 2019-05-30 NOTE — Lactation Note (Signed)
This note was copied from a baby's chart. Lactation Consultation Note  Patient Name: Samantha Knight Date: 05/30/2019 Reason for consult: Initial assessment;1st time breastfeeding Baby is 30 hours old.  Mom started giving formula las night due to difficult latch.  Mom is currently breastfeeding baby in football hold.  Baby latched well.  After a few minutes mom changed baby to cross cradle hold.  Mom has erect nipples and breast compressible.  Basic teaching done.  Discussed milk coming to volume and the prevention and treatment of engorgement.  Mom has a breast pump for home use.  Discouraged use of bottles the first 3 weeks.  Mom appears comfortable with positioning and latch.  Breastfeeding consultation services information given and reviewed.  Maternal Data    Feeding Feeding Type: Breast Fed  LATCH Score Latch: Grasps breast easily, tongue down, lips flanged, rhythmical sucking.  Audible Swallowing: A few with stimulation  Type of Nipple: Everted at rest and after stimulation  Comfort (Breast/Nipple): Soft / non-tender  Hold (Positioning): Assistance needed to correctly position infant at breast and maintain latch.  LATCH Score: 8  Interventions Interventions: Breast compression;Assisted with latch;Adjust position;Skin to skin;Support pillows;Breast massage;Position options  Lactation Tools Discussed/Used     Consult Status Consult Status: Complete    Huston Foley 05/30/2019, 9:45 AM

## 2019-06-02 ENCOUNTER — Encounter: Payer: Managed Care, Other (non HMO) | Admitting: Family Medicine

## 2019-06-04 ENCOUNTER — Telehealth: Payer: Self-pay | Admitting: *Deleted

## 2019-06-04 MED ORDER — HYDROCORTISONE ACETATE 25 MG RE SUPP: 25.0000 mg | Freq: Two times a day (BID) | RECTAL | 1 refills | Status: DC

## 2019-06-04 NOTE — Telephone Encounter (Signed)
Called pt to let her know we will send in anusol for her hemorrhoid and she is currently taking colace twice a  day and still unable to have BM. Advised pt she can try adding metamucil daily and see if that will help.

## 2019-06-15 ENCOUNTER — Other Ambulatory Visit: Payer: Self-pay

## 2019-06-15 ENCOUNTER — Encounter: Payer: Self-pay | Admitting: Family Medicine

## 2019-06-15 ENCOUNTER — Ambulatory Visit (INDEPENDENT_AMBULATORY_CARE_PROVIDER_SITE_OTHER): Payer: Managed Care, Other (non HMO) | Admitting: Family Medicine

## 2019-06-15 VITALS — BP 118/82 | HR 82 | Wt 159.0 lb

## 2019-06-15 DIAGNOSIS — R339 Retention of urine, unspecified: Secondary | ICD-10-CM

## 2019-06-15 DIAGNOSIS — K5901 Slow transit constipation: Secondary | ICD-10-CM

## 2019-06-15 DIAGNOSIS — K645 Perianal venous thrombosis: Secondary | ICD-10-CM

## 2019-06-15 LAB — POCT URINALYSIS DIPSTICK
Blood, UA: POSITIVE
Glucose, UA: NEGATIVE
Spec Grav, UA: 1.01 (ref 1.010–1.025)

## 2019-06-15 MED ORDER — PROCTOFOAM HC 1-1 % EX FOAM: 1.0000 | Freq: Two times a day (BID) | CUTANEOUS | 2 refills | Status: DC

## 2019-06-15 MED ORDER — DOCUSATE SODIUM 100 MG PO CAPS: 100.0000 mg | ORAL_CAPSULE | Freq: Two times a day (BID) | ORAL | 2 refills | Status: DC | PRN

## 2019-06-15 NOTE — Progress Notes (Signed)
° ° °  Subjective:    Patient ID: Samantha Knight is a 24 y.o. female presenting with Hemorrhoids  on 06/15/2019  HPI: Increase pain and swelling in her rectum. Has h/o hemorrhoids on Anusol suppositories. Also states she has not urinated since yesterday.  Review of Systems  Constitutional: Negative for chills and fever.  Respiratory: Negative for shortness of breath.   Cardiovascular: Negative for chest pain.  Gastrointestinal: Negative for abdominal pain, nausea and vomiting.  Genitourinary: Negative for dysuria.  Skin: Negative for rash.      Objective:    BP 118/82    Pulse 82    Wt 159 lb (72.1 kg)    BMI 28.17 kg/m  Physical Exam Constitutional:      General: She is not in acute distress.    Appearance: She is well-developed.  HENT:     Head: Normocephalic and atraumatic.  Eyes:     General: No scleral icterus. Cardiovascular:     Rate and Rhythm: Normal rate.  Pulmonary:     Effort: Pulmonary effort is normal.  Abdominal:     Palpations: Abdomen is soft.  Genitourinary:    Rectum: External hemorrhoid (one soft and one thrombosed) present.  Musculoskeletal:     Cervical back: Neck supple.  Skin:    General: Skin is warm and dry.  Neurological:     Mental Status: She is alert and oriented to person, place, and time.         Assessment & Plan:   Problem List Items Addressed This Visit    None    Visit Diagnoses    Slow transit constipation    -  Primary   Causing hemorrhoids--thrombosed--trial of proctofoam   Relevant Medications   hydrocortisone-pramoxine (PROCTOFOAM HC) rectal foam   docusate sodium (COLACE) 100 MG capsule   Other Relevant Orders   POCT urinalysis dipstick (Completed)   Unable to void       check urine--looks like infection--send for culture, check CMP   Relevant Orders   Comprehensive metabolic panel   Urine Culture      Total time: 30 minutes.  Return in about 2 weeks (around 06/29/2019) for pp check.  Reva Bores 06/15/2019 4:35 PM

## 2019-06-15 NOTE — Progress Notes (Signed)
Patient left without getting her blood work done.Called patient she will try to come back tomorrow.

## 2019-06-16 ENCOUNTER — Encounter: Payer: Self-pay | Admitting: *Deleted

## 2019-06-16 LAB — URINE CULTURE

## 2019-06-21 ENCOUNTER — Ambulatory Visit: Payer: Managed Care, Other (non HMO) | Admitting: Family Medicine

## 2019-06-23 NOTE — Progress Notes (Signed)
    Post Partum Visit Note  Samantha Knight is a 24 y.o. [id number] s/p 5/15 SVD/intact perineum.  I have fully reviewed the prenatal and intrapartum course. The delivery was at 38 gestational weeks after she presented in spontaneous labor.  Anesthesia: Epidual. Postpartum course has been uncomplicated.  Baby is doing well. Baby is feeding by bottle. Bleeding not at time. Bowel function is normal. Bladder function is normal  Patient is at this time sexually active. Contraception method is abstinence. Postpartum depression screening:negative    Review of Systems Pertinent items are noted in HPI.    Objective:  Blood pressure 118/78, pulse 79, weight 163 lb (73.9 kg)  NAD  Assessment:    Patient doing well postpartum exam  Plan:   Patient doing well. Negative pap 2019. BC options d/w her and she would like to do the patch.   RTC 3 m for annual   [place] Bing, MD [facility] [employer]

## 2019-06-24 ENCOUNTER — Encounter: Payer: Self-pay | Admitting: Radiology

## 2019-06-24 ENCOUNTER — Ambulatory Visit (INDEPENDENT_AMBULATORY_CARE_PROVIDER_SITE_OTHER): Payer: Managed Care, Other (non HMO) | Admitting: Obstetrics and Gynecology

## 2019-06-24 ENCOUNTER — Encounter: Payer: Self-pay | Admitting: Obstetrics and Gynecology

## 2019-06-24 ENCOUNTER — Other Ambulatory Visit: Payer: Self-pay

## 2019-06-24 MED ORDER — XULANE 150-35 MCG/24HR TD PTWK: 1.0000 | MEDICATED_PATCH | TRANSDERMAL | 12 refills | Status: DC

## 2019-06-25 ENCOUNTER — Encounter: Payer: Self-pay | Admitting: Obstetrics and Gynecology

## 2019-07-12 ENCOUNTER — Telehealth: Payer: Self-pay | Admitting: *Deleted

## 2019-07-12 NOTE — Telephone Encounter (Signed)
-----   Message from Rozann Lesches, NT sent at 07/12/2019  1:27 PM EDT ----- Regarding: bleeding question First cycle after postpartum, has question about heaviness and pain

## 2019-07-22 ENCOUNTER — Other Ambulatory Visit: Payer: Self-pay | Admitting: Obstetrics and Gynecology

## 2019-09-07 ENCOUNTER — Encounter: Payer: Self-pay | Admitting: Obstetrics and Gynecology

## 2019-09-07 ENCOUNTER — Other Ambulatory Visit: Payer: Self-pay

## 2019-09-07 ENCOUNTER — Ambulatory Visit (INDEPENDENT_AMBULATORY_CARE_PROVIDER_SITE_OTHER): Payer: Managed Care, Other (non HMO) | Admitting: Obstetrics and Gynecology

## 2019-09-07 VITALS — BP 105/75 | HR 112 | Wt 153.8 lb

## 2019-09-07 DIAGNOSIS — Z3045 Encounter for surveillance of transdermal patch hormonal contraceptive device: Secondary | ICD-10-CM

## 2019-09-07 MED ORDER — NORGESTIMATE-ETH ESTRADIOL 0.25-35 MG-MCG PO TABS
1.0000 | ORAL_TABLET | Freq: Every day | ORAL | 6 refills | Status: DC
Start: 2019-09-28 — End: 2022-03-15

## 2019-09-07 NOTE — Progress Notes (Signed)
Obstetrics and Gynecology Visit Return Patient Evaluation  Appointment Date: 09/07/2019  Primary Care Provider: Patient, No Pcp Per  OBGYN Clinic: [facility] for [facility]  Chief Complaint: patch side effects  History of Present Illness:  Samantha Knight is a 24 y.o. who started the patch last month. LMP two weeks ago and she is on her 2nd month of the patch. She states it makes her feel odd and is wondering about other options. She does not desire the IUD. She has only used condoms in the past  Review of Systems: as noted in the History of Present Illness.  Medications:  [name] had no medications administered during this visit. Current Outpatient Medications  Medication Sig Dispense Refill  . acetaminophen (TYLENOL) 500 MG tablet Take 1,000 mg by mouth every 6 (six) hours as needed for moderate pain. (Patient not taking: Reported on 06/24/2019)    . diphenhydrAMINE (BENADRYL) 25 MG tablet Take 25 mg by mouth every 6 (six) hours as needed for allergies. (Patient not taking: Reported on 06/24/2019)    . docusate sodium (COLACE) 100 MG capsule Take 1 capsule (100 mg total) by mouth 2 (two) times daily as needed. (Patient not taking: Reported on 06/24/2019) 30 capsule 2  . hydrocortisone (ANUSOL-HC) 25 MG suppository Place 1 suppository (25 mg total) rectally 2 (two) times daily. (Patient not taking: Reported on 06/24/2019) 12 suppository 1  . hydrocortisone-pramoxine (PROCTOFOAM HC) rectal foam Place 1 applicator rectally 2 (two) times daily. (Patient not taking: Reported on 06/24/2019) 10 g 2  . ibuprofen (ADVIL) 600 MG tablet Take 1 tablet (600 mg total) by mouth every 6 (six) hours. (Patient not taking: Reported on 06/24/2019) 30 tablet 0  . norelgestromin-ethinyl estradiol [facility]) 150-35 MCG/24HR transdermal patch Place 1 patch onto the skin once a week. (Patient not taking: Reported on 09/07/2019) 3 patch 12  . Prenat w/o A Vit-FeFum-FePo-FA (CONCEPT OB) 130-92.4-1 MG CAPS  Take 1 tablet by mouth daily. (Patient not taking: Reported on 05/10/2019) 30 capsule 12   No current facility-administered medications for this visit.    Allergies: has No Known Allergies.  Physical Exam:  BP 105/75   Pulse (!) 112   Wt 153 lb 12.8 oz (69.8 kg)   LMP 08/31/2019   BMI 27.24 kg/m  Body mass index is 27.24 kg/m. General appearance: Well nourished, well developed female in no acute distress.  Neuro/Psych:  Normal mood and affect.    Assessment: pt stable  Plan: Patient is self pay b/c her husband switched jobs and lost insurance. I encouraged her to look at [facility] and family planning medicaid. In the interim, I told her to try OCPs. Sprintec sent to pharmacy. I told her to finish out this month which is all th patches she has left and then to start the OCPs when due to start a new patch  RTC: PRN  Cornelia Copa MD Attending Center for [facility])

## 2020-02-07 ENCOUNTER — Other Ambulatory Visit: Payer: Self-pay

## 2020-02-07 ENCOUNTER — Ambulatory Visit: Payer: Medicaid Other | Admitting: Obstetrics and Gynecology

## 2020-02-07 ENCOUNTER — Encounter: Payer: Self-pay | Admitting: Obstetrics and Gynecology

## 2020-02-07 NOTE — Progress Notes (Signed)
Patient did not keep her GYN appointment for 02/07/2020.  Cornelia Copa MD Attending Center for [facility])

## 2020-10-19 IMAGING — US US MFM OB FOLLOW-UP
1 series · 14 of 28 positions shown · non-contrast
Comparison: none

[Series 1: us mfm ob follow-up · 49 acquisitions, 14 frames shown]
[im 2/49]
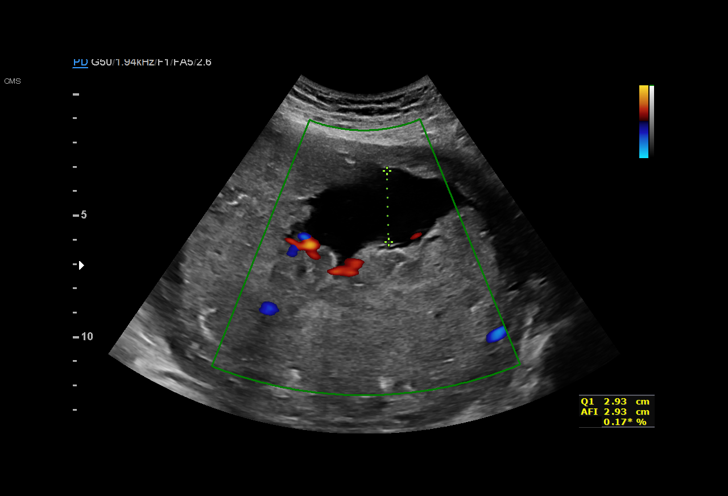
[im 6/49]
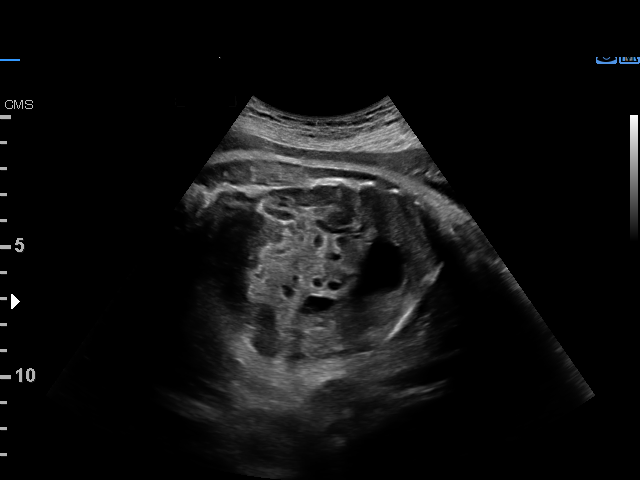
[im 9/49]
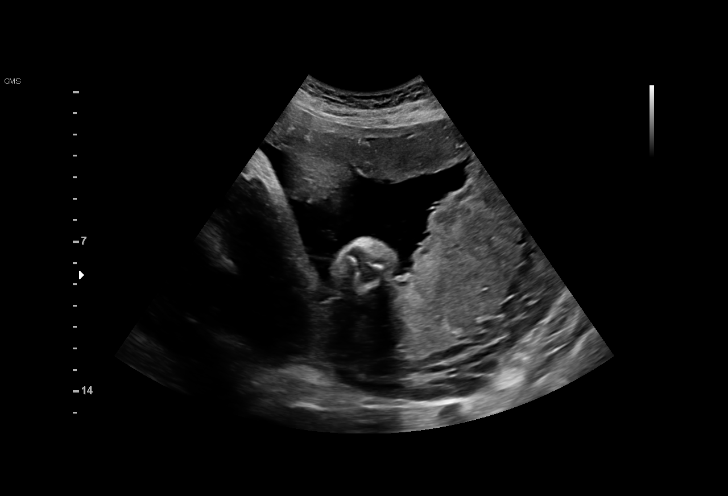
[im 13/49]
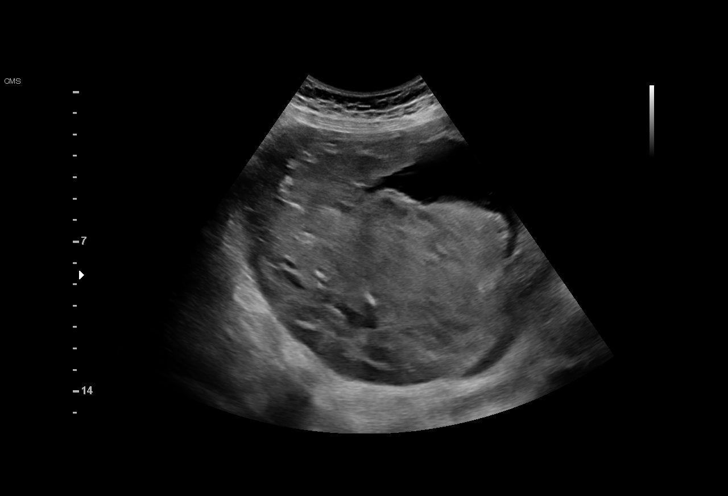
[im 17/49]
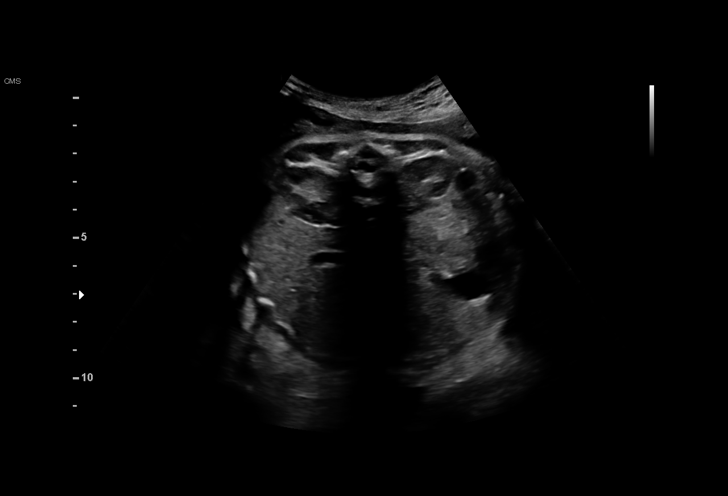
[im 20/49]
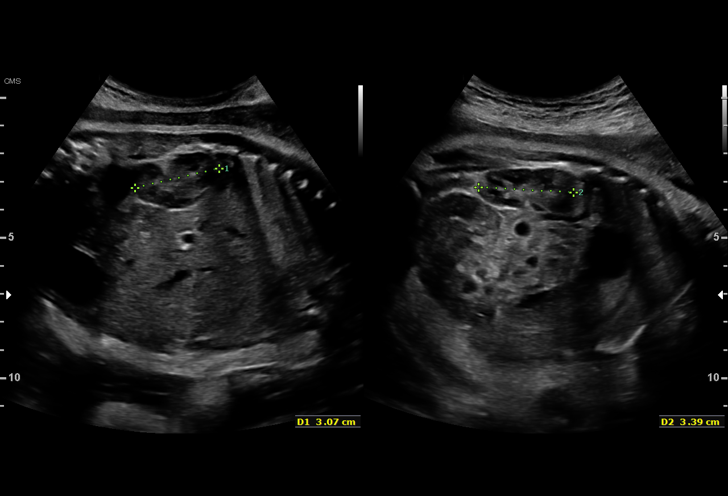
[im 24/49]
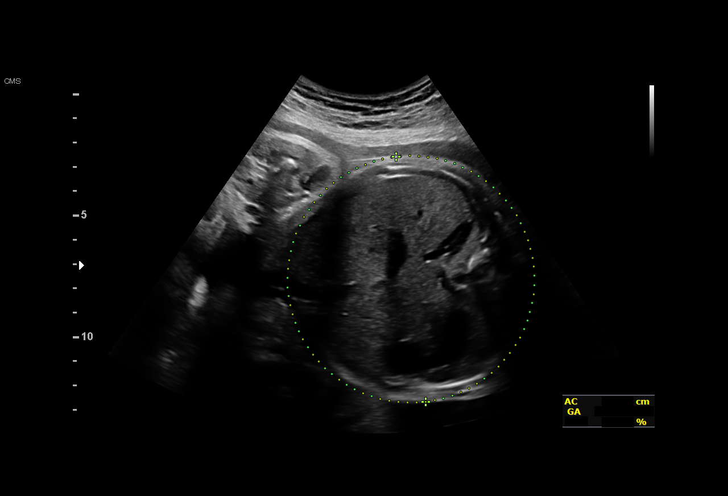
[im 27/49]
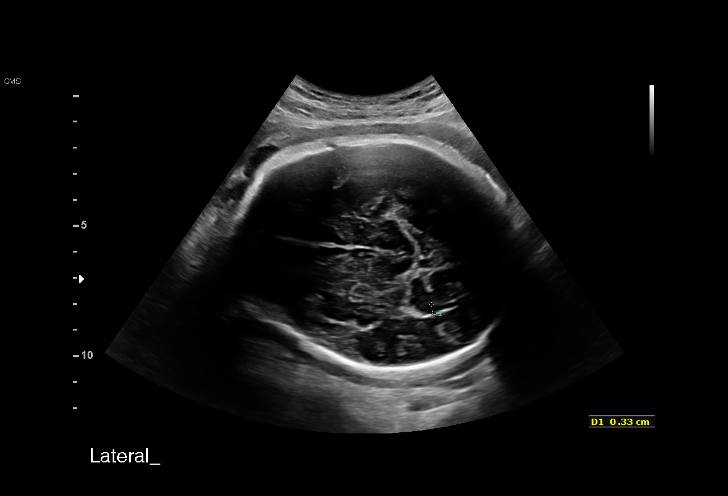
[im 31/49]
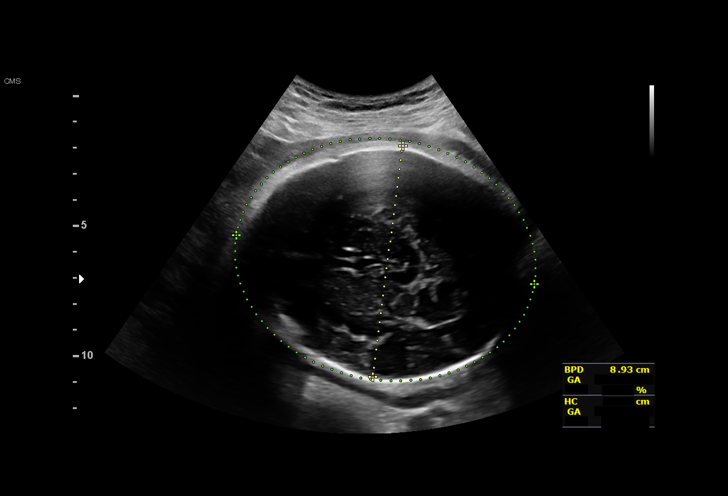
[im 34/49]
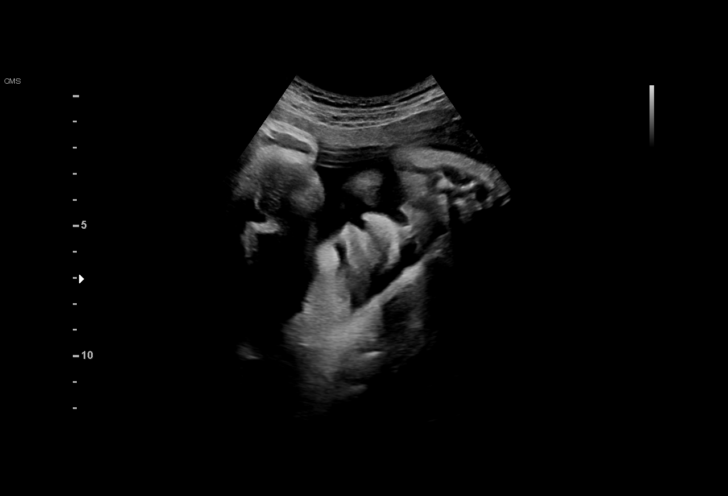
[im 38/49]
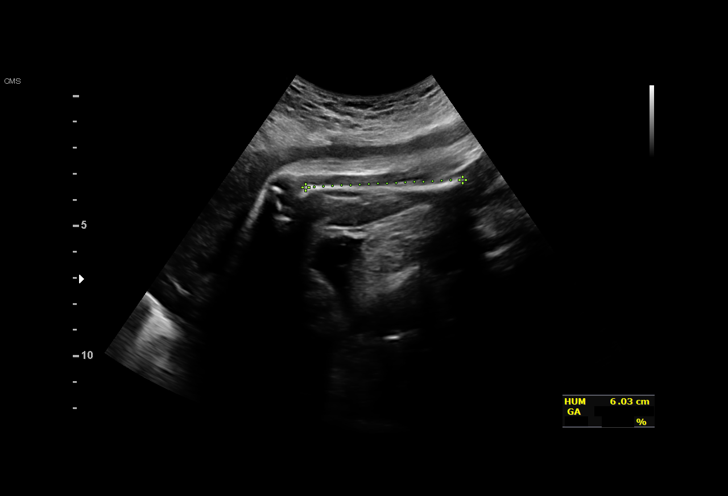
[im 41/49]
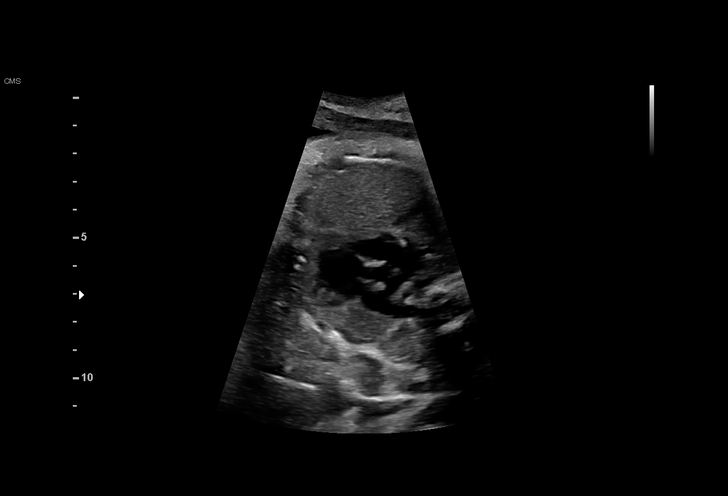
[im 45/49]
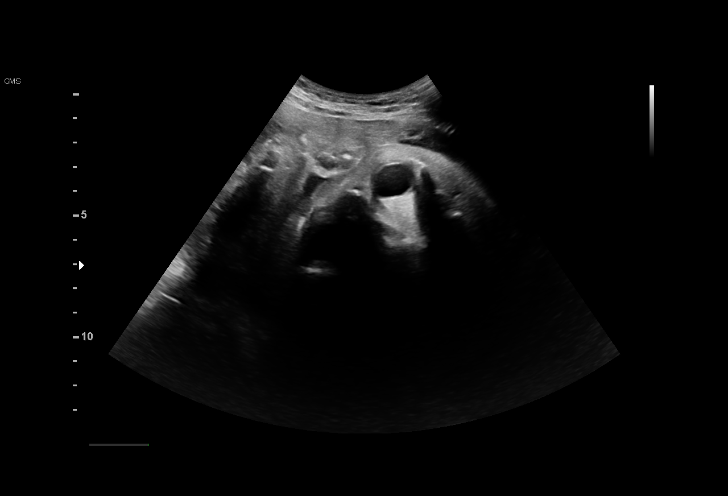
[im 49/49]
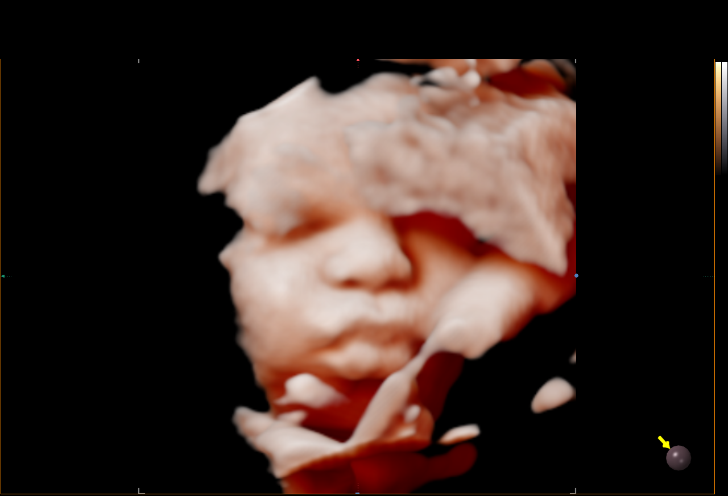

[14 of 28 positions shown; findings below may reference images not displayed]

----------------------------------------------------------------------

 ----------------------------------------------------------------------
Indications

  36 weeks gestation of pregnancy
  Encounter for other antenatal screening
  follow-up
  Uterine size-date discrepancy, third trimester
  (S<D)
 ----------------------------------------------------------------------
Vital Signs

                                                Height:        5'3"
Fetal Evaluation

 Num Of Fetuses:          1
 Fetal Heart Rate(bpm):   146
 Cardiac Activity:        Observed
 Presentation:            Cephalic
 Placenta:                Posterior
 P. Cord Insertion:       Visualized

 Amniotic Fluid
 AFI FV:      Within normal limits

 AFI Sum(cm)     %Tile       Largest Pocket(cm)
 15.05           55

 RUQ(cm)       RLQ(cm)       LUQ(cm)        LLQ(cm)

Biometry

 BPD:      89.6  mm     G. Age:  36w 2d         63  %    CI:        75.13   %    70 - 86
                                                         FL/HC:       21.0  %    20.1 -
 HC:      327.9  mm     G. Age:  37w 2d         47  %    HC/AC:       1.02       0.93 -
 AC:      320.5  mm     G. Age:  36w 0d         56  %    FL/BPD:      76.9  %    71 - 87
 FL:       68.9  mm     G. Age:  35w 3d         26  %    FL/AC:       21.5  %    20 - 24
 HUM:      60.5  mm     G. Age:  35w 1d         44  %

 Est. FW:    0244   gm     6 lb 3 oz     47  %
OB History

 Gravidity:    2         Term:   1
 Living:       1
Gestational Age

 LMP:           38w 1d        Date:  08/17/18                 EDD:   05/24/19
 U/S Today:     36w 2d                                        EDD:   06/06/19
 Best:          36w 1d     Det. By:  Early Ultrasound         EDD:   06/07/19
                                     (10/18/18)
Anatomy

 Cranium:               Appears normal         Aortic Arch:            Previously seen
 Cavum:                 Appears normal         Ductal Arch:            Previously seen
 Ventricles:            Appears normal         Diaphragm:              Previously seen
 Choroid Plexus:        Previously seen        Stomach:                Appears normal, left
                                                                       sided
 Cerebellum:            Previously seen        Abdomen:                Appears normal
 Posterior Fossa:       Previously seen        Abdominal Wall:         Previously seen
 Nuchal Fold:           Previously seen        Cord Vessels:           Previously seen
 Face:                  Appears normal         Kidneys:                Appear normal
                        (orbits and profile)
 Lips:                  Appears normal         Bladder:                Appears normal
 Thoracic:              Appears normal         Spine:                  Previously seen
 Heart:                 Previously seen        Upper Extremities:      Previously seen
 RVOT:                  Appears normal         Lower Extremities:      Previously seen
 LVOT:                  Appears normal

 Other:  Heels and 5th digit previously visualized.
Cervix Uterus Adnexa

 Cervix
 Not visualized (advanced GA >79wks)

 Uterus
 No abnormality visualized.

 Left Ovary
 Not visualized.

 Right Ovary
 Within normal limits.

 Cul De Sac
 No free fluid seen.

 Adnexa
 No abnormality visualized.
Impression

 Size and date descrepancy
 Normal interval growth
 Good fetal movement and amniotic fluid
Recommendations

 Follow up as clinically indicated.

## 2020-12-10 ENCOUNTER — Encounter (HOSPITAL_COMMUNITY): Payer: Self-pay

## 2020-12-10 ENCOUNTER — Emergency Department (HOSPITAL_COMMUNITY)
Admission: EM | Admit: 2020-12-10 | Discharge: 2020-12-10 | Disposition: A | Discharge status: Home / Self Care | Payer: Medicaid Other | Attending: Emergency Medicine | Admitting: Emergency Medicine

## 2020-12-10 ENCOUNTER — Other Ambulatory Visit: Payer: Self-pay

## 2020-12-10 DIAGNOSIS — Z20822 Contact with and (suspected) exposure to covid-19: Secondary | ICD-10-CM | POA: Diagnosis not present

## 2020-12-10 DIAGNOSIS — Z28310 Unvaccinated for covid-19: Secondary | ICD-10-CM | POA: Insufficient documentation

## 2020-12-10 DIAGNOSIS — Z87891 Personal history of nicotine dependence: Secondary | ICD-10-CM | POA: Diagnosis not present

## 2020-12-10 DIAGNOSIS — J101 Influenza due to other identified influenza virus with other respiratory manifestations: Secondary | ICD-10-CM | POA: Diagnosis not present

## 2020-12-10 DIAGNOSIS — Z8616 Personal history of COVID-19: Secondary | ICD-10-CM | POA: Insufficient documentation

## 2020-12-10 DIAGNOSIS — J45909 Unspecified asthma, uncomplicated: Secondary | ICD-10-CM | POA: Diagnosis not present

## 2020-12-10 DIAGNOSIS — J3489 Other specified disorders of nose and nasal sinuses: Secondary | ICD-10-CM | POA: Diagnosis not present

## 2020-12-10 DIAGNOSIS — R059 Cough, unspecified: Secondary | ICD-10-CM | POA: Diagnosis present

## 2020-12-10 LAB — CBC WITH DIFFERENTIAL/PLATELET
Abs Immature Granulocytes: 0 10*3/uL (ref 0.00–0.07)
Basophils Absolute: 0 10*3/uL (ref 0.0–0.1)
Basophils Relative: 0 %
Eosinophils Absolute: 0 10*3/uL (ref 0.0–0.5)
Eosinophils Relative: 0 %
HCT: 34.9 % — ABNORMAL LOW (ref 36.0–46.0)
Hemoglobin: 11.4 g/dL — ABNORMAL LOW (ref 12.0–15.0)
Lymphocytes Relative: 52 %
Lymphs Abs: 1.9 10*3/uL (ref 0.7–4.0)
MCH: 30.8 pg (ref 26.0–34.0)
MCHC: 32.7 g/dL (ref 30.0–36.0)
MCV: 94.3 fL (ref 80.0–100.0)
Monocytes Absolute: 0.1 10*3/uL (ref 0.1–1.0)
Monocytes Relative: 2 %
Neutro Abs: 1.7 10*3/uL (ref 1.7–7.7)
Neutrophils Relative %: 46 %
Platelets: 151 10*3/uL (ref 150–400)
RBC: 3.7 MIL/uL — ABNORMAL LOW (ref 3.87–5.11)
RDW: 12.3 % (ref 11.5–15.5)
WBC: 3.7 10*3/uL — ABNORMAL LOW (ref 4.0–10.5)
nRBC: 0 % (ref 0.0–0.2)
nRBC: 0 /100 WBC

## 2020-12-10 LAB — URINALYSIS, ROUTINE W REFLEX MICROSCOPIC
Bilirubin Urine: NEGATIVE
Glucose, UA: NEGATIVE mg/dL
Hgb urine dipstick: NEGATIVE
Ketones, ur: NEGATIVE mg/dL
Leukocytes,Ua: NEGATIVE
Nitrite: NEGATIVE
Protein, ur: NEGATIVE mg/dL
Specific Gravity, Urine: 1.023 (ref 1.005–1.030)
pH: 6 (ref 5.0–8.0)

## 2020-12-10 LAB — COMPREHENSIVE METABOLIC PANEL
ALT: 12 U/L (ref 0–44)
AST: 18 U/L (ref 15–41)
Albumin: 4.1 g/dL (ref 3.5–5.0)
Alkaline Phosphatase: 35 U/L — ABNORMAL LOW (ref 38–126)
Anion gap: 9 (ref 5–15)
BUN: 8 mg/dL (ref 6–20)
CO2: 23 mmol/L (ref 22–32)
Calcium: 8.8 mg/dL — ABNORMAL LOW (ref 8.9–10.3)
Chloride: 104 mmol/L (ref 98–111)
Creatinine, Ser: 0.75 mg/dL (ref 0.44–1.00)
GFR, Estimated: >60 mL/min (ref >60)
Glucose, Bld: 94 mg/dL (ref 70–99)
Potassium: 3.6 mmol/L (ref 3.5–5.1)
Sodium: 136 mmol/L (ref 135–145)
Total Bilirubin: 0.7 mg/dL (ref 0.3–1.2)
Total Protein: 6.9 g/dL (ref 6.5–8.1)

## 2020-12-10 LAB — I-STAT BETA HCG BLOOD, ED (MC, WL, AP ONLY): I-stat hCG, quantitative: <5 m[IU]/mL (ref <5)

## 2020-12-10 LAB — RESP PANEL BY RT-PCR (FLU A&B, COVID) ARPGX2
Influenza A by PCR: POSITIVE — AB
Influenza B by PCR: NEGATIVE
SARS Coronavirus 2 by RT PCR: NEGATIVE

## 2020-12-10 LAB — LIPASE, BLOOD: Lipase: 26 U/L (ref 11–51)

## 2020-12-10 MED ORDER — ONDANSETRON 4 MG PO TBDP
8.0000 mg | ORAL_TABLET | Freq: Once | ORAL | Status: AC
  Administered 2020-12-10: 16:00:00 8 mg via ORAL
  Filled 2020-12-10: qty 2

## 2020-12-10 MED ORDER — ONDANSETRON HCL 4 MG/2ML IJ SOLN: 4.0000 mg | Freq: Once | INTRAMUSCULAR | Status: DC

## 2020-12-10 MED ORDER — ONDANSETRON 4 MG PO TBDP: 4.0000 mg | ORAL_TABLET | Freq: Three times a day (TID) | ORAL | 0 refills | Status: DC | PRN

## 2020-12-10 MED ORDER — ONDANSETRON 4 MG PO TBDP: 8.0000 mg | ORAL_TABLET | Freq: Once | ORAL | Status: DC

## 2020-12-10 NOTE — Discharge Instructions (Addendum)
I am prescribing you a medication called Zofran.  This is a disintegrating tablet you can use up to 3 times a day for management of your nausea and vomiting.  Please only take this as prescribed.  Please only take this if you are experiencing nausea and vomiting that you cannot control.  If you develop any new or worsening symptoms please do not hesitate to return to the emergency department. It was a pleasure to meet you.  

## 2020-12-10 NOTE — ED Provider Notes (Signed)
Emergency Medicine Provider Triage Evaluation Note  Samantha Knight , a 25 y.o. female  was evaluated in triage.  Pt complains of cold sxs.  Review of Systems  Positive: Decreaed appetite, cold sxs Negative: Fever, cough  Physical Exam  BP 110/81 (BP Location: Right Arm)   Pulse 77   Temp 98.6 F (37 C)   Resp 17   SpO2 100%  Gen:   Awake, no distress   Resp:  Normal effort  MSK:   Moves extremities without difficulty  Other:    Medical Decision Making  Medically screening exam initiated at 1:48 PM.  Appropriate orders placed.  Samantha Knight was informed that the remainder of the evaluation will be completed by another provider, this initial triage assessment does not replace that evaluation, and the importance of remaining in the ED until their evaluation is complete.  Pt exposed to daughter with stomach bug a few days prior.  Had similar sxs, but now still report decrease in appetite and body aches.    Fayrene Helper, PA-C 12/10/20 1349    Terald Sleeper, MD 12/10/20 1357

## 2020-12-10 NOTE — ED Triage Notes (Signed)
Patient complains of loss of appetite x 5 days with nausea with intake and feeling full. Taking in fluids, some diarrhea with same. Currently on her menstrual cycle

## 2020-12-10 NOTE — ED Provider Notes (Signed)
Samantha Knight   CSN: [id number] Arrival date & time: 12/10/20  1225     History No chief complaint on file.   Samantha Knight is a 25 y.o. female.  HPI Patient is a 25 year old female with a medical history as noted below.  She presents to the emergency department due to nausea as well as decreased appetite.  Patient states that about 1 week ago she developed rhinorrhea, cough, nausea, vomiting, diarrhea.  She states that all of her symptoms resolved except for nausea and due to that she has had little p.o. intake for the past 5 days.  She states that she "feels hungry" but every time she eats becomes nauseated and cannot finish her food so she came to the emergency department today.  Denies any continued URI symptoms, chest pain, shortness of breath, syncope.  She has not been vaccinated for COVID-19 or the flu but does Knight a previous COVID-19 infection.    Past Medical History:  Diagnosis Date   Asthma    as a child.    Fundal height low for dates in third trimester 05/10/2019   s<d-->4/27 efw 47%, AC 56%   Migraine     Patient Active Problem List   Diagnosis Date Noted   Skin lesion 10/21/2017   BMI 30.0-30.9,adult 01/10/2017    Past Surgical History:  Procedure Laterality Date   NO PAST SURGERIES       OB History     Gravida  2   Para  2   Term  2   Preterm      AB      Living  2      SAB      IAB      Ectopic      Multiple  0   Live Births  2           Family History  Problem Relation Age of Onset   Cancer Neg Hx    Diabetes Neg Hx    Heart disease Neg Hx    Stroke Neg Hx     Social History   Tobacco Use   Smoking status: Former    Types: Cigars   Smokeless tobacco: Never   Tobacco comments:    quit with +UPT  Vaping Use   Vaping Use: Never used  Substance Use Topics   Alcohol use: No   Drug use: No    Home Medications Prior to Admission medications   Medication Sig  Start Date End Date Taking? Authorizing Provider  ondansetron (ZOFRAN-ODT) 4 MG disintegrating tablet Take 1 tablet (4 mg total) by mouth every 8 (eight) hours as needed for nausea or vomiting. 12/10/20  Yes Rayna Sexton, PA-C  acetaminophen (TYLENOL) 500 MG tablet Take 1,000 mg by mouth every 6 (six) hours as needed for moderate pain. Patient not taking: Reported on 06/24/2019    [provider]  diphenhydrAMINE (BENADRYL) 25 MG tablet Take 25 mg by mouth every 6 (six) hours as needed for allergies. Patient not taking: Reported on 06/24/2019    [provider]  docusate sodium (COLACE) 100 MG capsule Take 1 capsule (100 mg total) by mouth 2 (two) times daily as needed. Patient not taking: Reported on 06/24/2019 06/15/19   Donnamae Jude, MD  hydrocortisone (ANUSOL-HC) 25 MG suppository Place 1 suppository (25 mg total) rectally 2 (two) times daily. Patient not taking: Reported on 06/24/2019 06/04/19   Osborne Oman, MD  hydrocortisone-pramoxine (  PROCTOFOAM HC) rectal foam Place 1 applicator rectally 2 (two) times daily. Patient not taking: Reported on 06/24/2019 06/15/19   Donnamae Jude, MD  ibuprofen (ADVIL) 600 MG tablet Take 1 tablet (600 mg total) by mouth every 6 (six) hours. Patient not taking: Reported on 06/24/2019 05/30/19   Gavin Pound, CNM  norelgestromin-ethinyl estradiol Marilu Favre) 150-35 MCG/24HR transdermal patch Place 1 patch onto the skin once a week. Patient not taking: Reported on 09/07/2019 07/10/19   Aletha Halim, MD  norgestimate-ethinyl estradiol (ORTHO-CYCLEN) 0.25-35 MG-MCG tablet Take 1 tablet by mouth daily. After your next period 09/28/19   Aletha Halim, MD  Prenat w/o A Vit-FeFum-FePo-FA (CONCEPT OB) 130-92.4-1 MG CAPS Take 1 tablet by mouth daily. Patient not taking: Reported on 05/10/2019 10/18/18   Manya Silvas, [place]    Allergies    Patient has no known allergies.  Review of Systems   Review of Systems  All other systems reviewed and are  negative. Ten systems reviewed and are negative for acute change, except as noted in the HPI.   Physical Exam Updated Vital Signs BP 110/81 (BP Location: Right Arm)   Pulse 77   Temp 98.6 F (37 C)   Resp 17   Wt 53.3 kg   SpO2 100%   BMI 20.82 kg/m   Physical Exam Vitals and nursing Knight reviewed.  Constitutional:      General: She is not in acute distress.    Appearance: Normal appearance. She is not ill-appearing, toxic-appearing or diaphoretic.  HENT:     Head: Normocephalic and atraumatic.     Right Ear: External ear normal.     Left Ear: External ear normal.     Nose: Nose normal.     Mouth/Throat:     Mouth: Mucous membranes are moist.     Pharynx: Oropharynx is clear. No oropharyngeal exudate or posterior oropharyngeal erythema.  Eyes:     Extraocular Movements: Extraocular movements intact.  Cardiovascular:     Rate and Rhythm: Normal rate and regular rhythm.     Pulses: Normal pulses.     Heart sounds: Normal heart sounds. No murmur heard.   No friction rub. No gallop.  Pulmonary:     Effort: Pulmonary effort is normal. No respiratory distress.     Breath sounds: Normal breath sounds. No stridor. No wheezing, rhonchi or rales.  Abdominal:     General: Abdomen is flat.     Palpations: Abdomen is soft.     Tenderness: There is no abdominal tenderness.     Comments: Abdomen is flat, soft, and nontender.  Musculoskeletal:        General: Normal range of motion.     Cervical back: Normal range of motion and neck supple. No tenderness.  Skin:    General: Skin is warm and dry.  Neurological:     General: No focal deficit present.     Mental Status: She is alert and oriented to person, place, and time.  Psychiatric:        Mood and Affect: Mood normal.        Behavior: Behavior normal.   ED Results / Procedures / Treatments   Labs (all labs ordered are listed, but only abnormal results are displayed) Labs Reviewed  RESP PANEL BY RT-PCR (FLU A&B, COVID)  ARPGX2 - Abnormal; Notable for the following components:      Result Value   Influenza A by PCR POSITIVE (*)    All other components within normal limits  CBC  WITH DIFFERENTIAL/PLATELET - Abnormal; Notable for the following components:   WBC 3.7 (*)    RBC 3.70 (*)    Hemoglobin 11.4 (*)    HCT 34.9 (*)    All other components within normal limits  COMPREHENSIVE METABOLIC PANEL - Abnormal; Notable for the following components:   Calcium 8.8 (*)    Alkaline Phosphatase 35 (*)    All other components within normal limits  LIPASE, BLOOD  URINALYSIS, ROUTINE W REFLEX MICROSCOPIC  I-STAT BETA HCG BLOOD, ED ([facility], [facility], [facility] ONLY)   EKG None  Radiology No results found.  Procedures Procedures   Medications Ordered in ED Medications - No data to display  ED Course  I have reviewed the triage vital signs and the nursing notes.  Pertinent labs & imaging results that were available during my care of the patient were reviewed by me and considered in my medical decision making (see chart for details).  Clinical Course as of 12/10/20 1604  Sun Dec 10, 2020  1520 Influenza A By PCR(!): POSITIVE [LJ]    Clinical Course User Index [LJ] Rayna Sexton, PA-C   MDM Rules/Calculators/A&P                          Pt is a 25 y.o. nontoxic-appearing female who presents to the emergency department due to nausea and decreased appetite.  Labs: CBC with a white count of 3.7, hemoglobin of 11.4, hematocrit of 34.9. CMP with a calcium of 8.8 and an alk phos of 35. Lipase of 26. I-STAT beta-hCG less than 5. UA is negative. Respiratory panel is positive for influenza A.  I, Rayna Sexton, PA-C, personally reviewed and evaluated these images and lab results as part of my medical decision-making.  Patient appears to have symptoms associated with influenza A.  She states that her URI symptoms have mostly resolved but is still experiencing nausea as well as decreased appetite.  Due to this she  has had little p.o. intake for the past 5 days.  CBC shows a leukopenia of 3.7 with a hemoglobin of 11.4.  Mild hypocalcemia of 8.8 on CMP but otherwise no electrolyte derangements.  Lipase within normal limits at 26.  Patient requests to be discharged at this time.  Feel that this is reasonable.  She is currently afebrile and nontachycardic.  Nontoxic-appearing.  Will discharge on a course of Zofran for her nausea.  We discussed return precautions.  Her questions were answered and she was amicable at the time of discharge.  Knight: Portions of this report may have been transcribed using voice recognition software. Every effort was made to ensure accuracy; however, inadvertent computerized transcription errors may be present.   Final Clinical Impression(s) / ED Diagnoses Final diagnoses:  Influenza A    Rx / DC Orders ED Discharge Orders          Ordered    ondansetron (ZOFRAN-ODT) 4 MG disintegrating tablet  Every 8 hours PRN        12/10/20 1559             Rayna Sexton, PA-C 12/10/20 1604    Luna Fuse, MD 12/13/20 1433

## 2021-01-28 DIAGNOSIS — E559 Vitamin D deficiency, unspecified: Secondary | ICD-10-CM | POA: Diagnosis not present

## 2021-01-28 DIAGNOSIS — Z13220 Encounter for screening for lipoid disorders: Secondary | ICD-10-CM | POA: Diagnosis not present

## 2021-01-28 DIAGNOSIS — R5383 Other fatigue: Secondary | ICD-10-CM | POA: Diagnosis not present

## 2021-01-28 DIAGNOSIS — Z1159 Encounter for screening for other viral diseases: Secondary | ICD-10-CM | POA: Diagnosis not present

## 2021-01-28 DIAGNOSIS — Z0000 Encounter for general adult medical examination without abnormal findings: Secondary | ICD-10-CM | POA: Diagnosis not present

## 2021-03-11 ENCOUNTER — Emergency Department (HOSPITAL_COMMUNITY): Payer: Medicaid Other

## 2021-03-11 ENCOUNTER — Emergency Department (HOSPITAL_COMMUNITY)
Admission: EM | Admit: 2021-03-11 | Discharge: 2021-03-11 | Disposition: A | Discharge status: Home / Self Care | Payer: Medicaid Other | Attending: Emergency Medicine | Admitting: Emergency Medicine

## 2021-03-11 ENCOUNTER — Other Ambulatory Visit: Payer: Self-pay

## 2021-03-11 ENCOUNTER — Encounter (HOSPITAL_COMMUNITY): Payer: Self-pay | Admitting: Emergency Medicine

## 2021-03-11 DIAGNOSIS — R051 Acute cough: Secondary | ICD-10-CM | POA: Diagnosis not present

## 2021-03-11 DIAGNOSIS — R062 Wheezing: Secondary | ICD-10-CM | POA: Insufficient documentation

## 2021-03-11 DIAGNOSIS — R059 Cough, unspecified: Secondary | ICD-10-CM | POA: Diagnosis not present

## 2021-03-11 DIAGNOSIS — Z20822 Contact with and (suspected) exposure to covid-19: Secondary | ICD-10-CM | POA: Diagnosis not present

## 2021-03-11 DIAGNOSIS — R0602 Shortness of breath: Secondary | ICD-10-CM | POA: Insufficient documentation

## 2021-03-11 DIAGNOSIS — R0789 Other chest pain: Secondary | ICD-10-CM | POA: Insufficient documentation

## 2021-03-11 LAB — RESP PANEL BY RT-PCR (FLU A&B, COVID) ARPGX2
Influenza A by PCR: NEGATIVE
Influenza B by PCR: NEGATIVE
SARS Coronavirus 2 by RT PCR: NEGATIVE

## 2021-03-11 MED ORDER — ALBUTEROL SULFATE HFA 108 (90 BASE) MCG/ACT IN AERS: 1.0000 | INHALATION_SPRAY | Freq: Four times a day (QID) | RESPIRATORY_TRACT | 0 refills | Status: DC | PRN

## 2021-03-11 MED ORDER — ACETAMINOPHEN 325 MG PO TABS
650.0000 mg | ORAL_TABLET | Freq: Once | ORAL | Status: AC
  Administered 2021-03-11: 650 mg via ORAL
  Filled 2021-03-11: qty 2

## 2021-03-11 MED ORDER — PREDNISONE 20 MG PO TABS: 20.0000 mg | ORAL_TABLET | Freq: Two times a day (BID) | ORAL | 0 refills | Status: AC

## 2021-03-11 MED ORDER — DEXAMETHASONE SODIUM PHOSPHATE 10 MG/ML IJ SOLN
10.0000 mg | Freq: Once | INTRAMUSCULAR | Status: AC
  Administered 2021-03-11: 10 mg via INTRAMUSCULAR
  Filled 2021-03-11: qty 1

## 2021-03-11 NOTE — ED Triage Notes (Signed)
Pt reports SOB, wheezing, coughing, and chest pain since yesterday.

## 2021-03-11 NOTE — Discharge Instructions (Signed)
Your chest x-ray was normal.  Use inhaler as needed at home if your breathing worsens.  Follow-up with your doctor within the next week.  Return back to the ER if your symptoms worsen or if you have any additional concerns.

## 2021-03-11 NOTE — ED Provider Notes (Signed)
[facility] EMERGENCY DEPARTMENT Provider Note   CSN: [id number] Arrival date & time: 03/11/21  1027     History  Chief Complaint  Patient presents with   Shortness of Breath   Chest Pain   Cough    Samantha Knight is a 26 y.o. female.  Patient presents with shortness of breath wheezing cough and chest tightness since last night.  Denies any fevers denies vomiting denies diarrhea.  She states she used her son's inhaler and felt better afterwards.      Home Medications Prior to Admission medications   Medication Sig Start Date End Date Taking? Authorizing Provider  albuterol (VENTOLIN HFA) 108 (90 Base) MCG/ACT inhaler Inhale 1-2 puffs into the lungs every 6 (six) hours as needed for up to 10 days for wheezing or shortness of breath. 03/11/21 03/21/21 Yes Nazaiah Navarrete, Eustace Moore, MD  predniSONE (DELTASONE) 20 MG tablet Take 1 tablet (20 mg total) by mouth 2 (two) times daily with a meal for 5 days. 03/11/21 03/16/21 Yes Cheryll Cockayne, MD  acetaminophen (TYLENOL) 500 MG tablet Take 1,000 mg by mouth every 6 (six) hours as needed for moderate pain. Patient not taking: Reported on 06/24/2019    [provider]  diphenhydrAMINE (BENADRYL) 25 MG tablet Take 25 mg by mouth every 6 (six) hours as needed for allergies. Patient not taking: Reported on 06/24/2019    [provider]  docusate sodium (COLACE) 100 MG capsule Take 1 capsule (100 mg total) by mouth 2 (two) times daily as needed. Patient not taking: Reported on 06/24/2019 06/15/19   Reva Bores, MD  hydrocortisone (ANUSOL-HC) 25 MG suppository Place 1 suppository (25 mg total) rectally 2 (two) times daily. Patient not taking: Reported on 06/24/2019 06/04/19   Tereso Newcomer, MD  hydrocortisone-pramoxine (PROCTOFOAM HC) rectal foam Place 1 applicator rectally 2 (two) times daily. Patient not taking: Reported on 06/24/2019 06/15/19   Reva Bores, MD  ibuprofen (ADVIL) 600 MG tablet Take 1 tablet (600 mg  total) by mouth every 6 (six) hours. Patient not taking: Reported on 06/24/2019 05/30/19   Gerrit Heck, CNM  norelgestromin-ethinyl estradiol [facility]) 150-35 MCG/24HR transdermal patch Place 1 patch onto the skin once a week. Patient not taking: Reported on 09/07/2019 07/10/19   [place] Bing, MD  norgestimate-ethinyl estradiol (ORTHO-CYCLEN) 0.25-35 MG-MCG tablet Take 1 tablet by mouth daily. After your next period 09/28/19   [place] Bing, MD  ondansetron (ZOFRAN-ODT) 4 MG disintegrating tablet Take 1 tablet (4 mg total) by mouth every 8 (eight) hours as needed for nausea or vomiting. 12/10/20   Placido Sou, PA-C  Prenat w/o A Vit-FeFum-FePo-FA (CONCEPT OB) 130-92.4-1 MG CAPS Take 1 tablet by mouth daily. Patient not taking: Reported on 05/10/2019 10/18/18   Dorathy Kinsman, CNM      Allergies    Patient has no known allergies.    Review of Systems   Review of Systems  Constitutional:  Negative for fever.  HENT:  Negative for ear pain.   Eyes:  Negative for pain.  Respiratory:  Positive for cough and shortness of breath.   Cardiovascular:  Negative for chest pain.  Gastrointestinal:  Negative for abdominal pain.  Genitourinary:  Negative for flank pain.  Musculoskeletal:  Negative for back pain.  Skin:  Negative for rash.  Neurological:  Negative for headaches.   Physical Exam Updated Vital Signs BP 127/85 (BP Location: Left Arm)    Pulse 100    Temp 98.8 F (37.1 C) (Oral)  Resp 14    LMP 03/04/2021    SpO2 93%  Physical Exam Constitutional:      General: She is not in acute distress.    Appearance: Normal appearance.  HENT:     Head: Normocephalic.     Nose: Nose normal.  Eyes:     Extraocular Movements: Extraocular movements intact.  Cardiovascular:     Rate and Rhythm: Normal rate.  Pulmonary:     Effort: Pulmonary effort is normal.     Breath sounds: No decreased breath sounds or wheezing.  Musculoskeletal:        General: Normal range of motion.      Cervical back: Normal range of motion.  Neurological:     General: No focal deficit present.     Mental Status: She is alert. Mental status is at baseline.    ED Results / Procedures / Treatments   Labs (all labs ordered are listed, but only abnormal results are displayed) Labs Reviewed  RESP PANEL BY RT-PCR (FLU A&B, COVID) [id number]    EKG EKG Interpretation  Date/Time:  "Sunday March 11 2021 10:46:21 EST Ventricular Rate:  97 PR Interval:  144 QRS Duration: 72 QT Interval:  330 QTC Calculation: 419 R Axis:   80 Text Interpretation: Normal sinus rhythm Nonspecific T wave abnormality Abnormal ECG No previous ECGs available Confirmed by Heston Widener ([id number]) on 03/11/2021 11:29:05 AM  Radiology DG Chest 2 View  Result Date: 03/11/2021 CLINICAL DATA:  Shortness of breath, wheezing, and cough for several days EXAM: CHEST - 2 VIEW COMPARISON:  None. FINDINGS: The heart size and mediastinal contours are within normal limits. Both lungs are clear. The visualized skeletal structures are unremarkable. IMPRESSION: Normal exam. Electronically Signed   By: John A Stahl M.D.   On: 03/11/2021 11:06    Procedures Procedures    Medications Ordered in ED Medications  dexamethasone (DECADRON) injection 10 mg (10 mg Intramuscular Given 03/11/21 1236)    ED Course/ Medical Decision Making/ A&P                           Medical Decision Making Amount and/or Complexity of Data Reviewed Radiology: ordered.  Risk Prescription drug management.   Review of records shows office visit September 07, 2019 for transdermal patch.  Patient on telemetry, sinus rhythm, normal rate, no arrhythmias noted.  On exam she has normal vital signs, initial O2 sat was 93% on room air, however upon my evaluation she was 97% to 98% on room air which is appropriate.  Chest x-ray is unremarkable.  Viral panel is negative.  No wheezes on lung exam and good air movement noted.  Patient given Decadron steroid shot.  I  offered her a breathing treatment given that she had taken 1 at home from her son's machine, however she has no wheezes today and she declined.  Advising outpatient follow-up with her doctor within the week, advised return for any worsening symptoms or additional concerns.        Final Clinical Impression(s) / ED Diagnoses Final diagnoses:  Acute cough  Shortness of breath    Rx / DC Orders ED Discharge Orders          Ordered    predniSONE (DELTASONE) 20 MG tablet  2 times daily with meals        02" /26/23 1238    albuterol (VENTOLIN HFA) 108 (90 Base) MCG/ACT inhaler  Every 6 hours PRN  03/11/21 1238              Cheryll Cockayne, MD 03/11/21 [phone]

## 2021-05-04 DIAGNOSIS — H5213 Myopia, bilateral: Secondary | ICD-10-CM | POA: Diagnosis not present

## 2021-05-13 DIAGNOSIS — K648 Other hemorrhoids: Secondary | ICD-10-CM | POA: Diagnosis not present

## 2021-05-15 ENCOUNTER — Emergency Department (HOSPITAL_COMMUNITY)
Admission: EM | Admit: 2021-05-15 | Discharge: 2021-05-15 | Discharge status: Against Medical Advice | Payer: Medicaid Other | Attending: Student | Admitting: Student

## 2021-05-15 ENCOUNTER — Emergency Department (HOSPITAL_COMMUNITY): Payer: Medicaid Other

## 2021-05-15 DIAGNOSIS — R339 Retention of urine, unspecified: Secondary | ICD-10-CM | POA: Diagnosis not present

## 2021-05-15 DIAGNOSIS — R112 Nausea with vomiting, unspecified: Secondary | ICD-10-CM | POA: Diagnosis not present

## 2021-05-15 DIAGNOSIS — R109 Unspecified abdominal pain: Secondary | ICD-10-CM | POA: Insufficient documentation

## 2021-05-15 DIAGNOSIS — N9489 Other specified conditions associated with female genital organs and menstrual cycle: Secondary | ICD-10-CM | POA: Diagnosis not present

## 2021-05-15 DIAGNOSIS — Z5321 Procedure and treatment not carried out due to patient leaving prior to being seen by health care provider: Secondary | ICD-10-CM | POA: Diagnosis not present

## 2021-05-15 DIAGNOSIS — K649 Unspecified hemorrhoids: Secondary | ICD-10-CM | POA: Diagnosis not present

## 2021-05-15 DIAGNOSIS — R1032 Left lower quadrant pain: Secondary | ICD-10-CM | POA: Diagnosis not present

## 2021-05-15 LAB — COMPREHENSIVE METABOLIC PANEL
ALT: 15 U/L (ref 0–44)
AST: 36 U/L (ref 15–41)
Albumin: 4.1 g/dL (ref 3.5–5.0)
Alkaline Phosphatase: 34 U/L — ABNORMAL LOW (ref 38–126)
Anion gap: 8 (ref 5–15)
BUN: 8 mg/dL (ref 6–20)
CO2: 21 mmol/L — ABNORMAL LOW (ref 22–32)
Calcium: 9 mg/dL (ref 8.9–10.3)
Chloride: 109 mmol/L (ref 98–111)
Creatinine, Ser: 0.71 mg/dL (ref 0.44–1.00)
GFR, Estimated: >60 mL/min (ref >60)
Glucose, Bld: 101 mg/dL — ABNORMAL HIGH (ref 70–99)
Potassium: 3.8 mmol/L (ref 3.5–5.1)
Sodium: 138 mmol/L (ref 135–145)
Total Bilirubin: 0.5 mg/dL (ref 0.3–1.2)
Total Protein: 6.7 g/dL (ref 6.5–8.1)

## 2021-05-15 LAB — URINALYSIS, ROUTINE W REFLEX MICROSCOPIC
Bilirubin Urine: NEGATIVE
Glucose, UA: NEGATIVE mg/dL
Hgb urine dipstick: NEGATIVE
Ketones, ur: NEGATIVE mg/dL
Leukocytes,Ua: NEGATIVE
Nitrite: NEGATIVE
Protein, ur: NEGATIVE mg/dL
Specific Gravity, Urine: 1.014 (ref 1.005–1.030)
pH: 7 (ref 5.0–8.0)

## 2021-05-15 LAB — LIPASE, BLOOD: Lipase: 26 U/L (ref 11–51)

## 2021-05-15 LAB — CBC
HCT: 35.7 % — ABNORMAL LOW (ref 36.0–46.0)
Hemoglobin: 12 g/dL (ref 12.0–15.0)
MCH: 31.5 pg (ref 26.0–34.0)
MCHC: 33.6 g/dL (ref 30.0–36.0)
MCV: 93.7 fL (ref 80.0–100.0)
Platelets: 142 10*3/uL — ABNORMAL LOW (ref 150–400)
RBC: 3.81 MIL/uL — ABNORMAL LOW (ref 3.87–5.11)
RDW: 13.7 % (ref 11.5–15.5)
WBC: 4.3 10*3/uL (ref 4.0–10.5)
nRBC: 0 % (ref 0.0–0.2)

## 2021-05-15 LAB — I-STAT BETA HCG BLOOD, ED (MC, WL, AP ONLY): I-stat hCG, quantitative: <5 m[IU]/mL (ref <5)

## 2021-05-15 NOTE — ED Triage Notes (Signed)
Pt. Stated, Samantha Knight had stomach pain and unable to urinate and my stomach hurts real bad. Im N/V  . I saw a Dr. For hemorrhoids and was given medication but has not helped.Marland Kitchen   ?

## 2021-05-15 NOTE — ED Notes (Signed)
Pt was called multiple times for a CT no answer. ?

## 2021-05-15 NOTE — ED Provider Triage Note (Signed)
Emergency Medicine Provider Triage Evaluation Note ? ?Samantha Knight , a 26 y.o. female  was evaluated in triage.  Pt complains of abdominal and rectal pain.  Reports she saw her doctor 2 days ago and was diagnosed with a hemorrhoid.  Given rectal suppositories.  Says that she has been doing this for 2 days however she woke up and the pain was severely worse.  Also having abdominal and back pain.  Denies fever but endorses chills. ? ?Says that she urinated this morning however sitting on the toilet hurts.  Has not been able to have a bowel movement due to the pain for the past couple days ? ?Review of Systems  ?Positive: Abdominal pain, nausea, vomiting, rectal pain ?Negative: Dysuria, hematochezia or melena ? ?Physical Exam  ?BP 105/90 (BP Location: Right Arm)   Pulse 84   Temp 98.5 ?F (36.9 ?C) (Oral)   Resp 14   Ht 5\' 3"  (1.6 m)   Wt 53.5 kg   SpO2 100%   BMI 20.90 kg/m?  ?Gen:   Awake, no distress   ?Resp:  Normal effort  ?MSK:   Moves extremities without difficulty  ?Other:  Generalized abdominal tenderness, worse in the left lower quadrant and suprapubic areas ? ?Medical Decision Making  ?Medically screening exam initiated at 9:30 AM.  Appropriate orders placed.  was informed that the remainder of the evaluation will be completed by another provider, this initial triage assessment does not replace that evaluation, and the importance of remaining in the ED until their evaluation is complete. ? ? ?  ?Samantha Pyle, PA-C ?05/15/21 0932 ? ?

## 2021-05-22 DIAGNOSIS — H52221 Regular astigmatism, right eye: Secondary | ICD-10-CM | POA: Diagnosis not present

## 2021-05-22 DIAGNOSIS — H5213 Myopia, bilateral: Secondary | ICD-10-CM | POA: Diagnosis not present

## 2021-07-23 DIAGNOSIS — N898 Other specified noninflammatory disorders of vagina: Secondary | ICD-10-CM | POA: Diagnosis not present

## 2021-07-23 DIAGNOSIS — B3731 Acute candidiasis of vulva and vagina: Secondary | ICD-10-CM | POA: Diagnosis not present

## 2021-07-23 DIAGNOSIS — Z6822 Body mass index (BMI) 22.0-22.9, adult: Secondary | ICD-10-CM | POA: Diagnosis not present

## 2021-07-23 DIAGNOSIS — N911 Secondary amenorrhea: Secondary | ICD-10-CM | POA: Diagnosis not present

## 2021-07-24 ENCOUNTER — Emergency Department (HOSPITAL_COMMUNITY)
Admission: EM | Admit: 2021-07-24 | Discharge: 2021-07-24 | Discharge status: Against Medical Advice | Payer: Medicaid Other | Attending: Emergency Medicine | Admitting: Emergency Medicine

## 2021-07-24 ENCOUNTER — Encounter (HOSPITAL_COMMUNITY): Payer: Self-pay | Admitting: Emergency Medicine

## 2021-07-24 ENCOUNTER — Emergency Department (HOSPITAL_COMMUNITY): Payer: Medicaid Other

## 2021-07-24 ENCOUNTER — Other Ambulatory Visit: Payer: Self-pay

## 2021-07-24 DIAGNOSIS — R0602 Shortness of breath: Secondary | ICD-10-CM | POA: Diagnosis not present

## 2021-07-24 DIAGNOSIS — Z5321 Procedure and treatment not carried out due to patient leaving prior to being seen by health care provider: Secondary | ICD-10-CM | POA: Insufficient documentation

## 2021-07-24 DIAGNOSIS — R079 Chest pain, unspecified: Secondary | ICD-10-CM | POA: Diagnosis present

## 2021-07-24 DIAGNOSIS — R0789 Other chest pain: Secondary | ICD-10-CM | POA: Diagnosis not present

## 2021-07-24 LAB — CBC
HCT: 36.4 % (ref 36.0–46.0)
Hemoglobin: 11.8 g/dL — ABNORMAL LOW (ref 12.0–15.0)
MCH: 31.1 pg (ref 26.0–34.0)
MCHC: 32.4 g/dL (ref 30.0–36.0)
MCV: 96 fL (ref 80.0–100.0)
Platelets: 166 10*3/uL (ref 150–400)
RBC: 3.79 MIL/uL — ABNORMAL LOW (ref 3.87–5.11)
RDW: 13.2 % (ref 11.5–15.5)
WBC: 5.5 10*3/uL (ref 4.0–10.5)
nRBC: 0 % (ref 0.0–0.2)

## 2021-07-24 LAB — TROPONIN I (HIGH SENSITIVITY): Troponin I (High Sensitivity): <2 ng/L (ref <18)

## 2021-07-24 LAB — I-STAT BETA HCG BLOOD, ED (MC, WL, AP ONLY): I-stat hCG, quantitative: <5 m[IU]/mL (ref <5)

## 2021-07-24 LAB — BASIC METABOLIC PANEL
Anion gap: 8 (ref 5–15)
BUN: 8 mg/dL (ref 6–20)
CO2: 22 mmol/L (ref 22–32)
Calcium: 9.3 mg/dL (ref 8.9–10.3)
Chloride: 107 mmol/L (ref 98–111)
Creatinine, Ser: 0.75 mg/dL (ref 0.44–1.00)
GFR, Estimated: >60 mL/min (ref >60)
Glucose, Bld: 90 mg/dL (ref 70–99)
Potassium: 5.1 mmol/L (ref 3.5–5.1)
Sodium: 137 mmol/L (ref 135–145)

## 2021-07-24 NOTE — ED Notes (Signed)
Pt called multiple times by ED staff, registration, and radiology, no answer

## 2021-07-24 NOTE — ED Triage Notes (Signed)
Patient here with chest pain and shortness of breath.  Patient states that she started fluconazole and has been having these symptoms since starting taking the meds.  Patient states that she spoke with the advice line and to come into the ED.  Patient denies any nausea or vomiting.

## 2021-07-24 NOTE — ED Provider Triage Note (Signed)
Emergency Medicine Provider Triage Evaluation Note  Samantha Knight , a 26 y.o. female  was evaluated in triage.  Pt complains of chest pain and shortness of breath that started last night.  She states pain has been fairly constant and she describes it is either a "pulling" or "pushing".  She states that she saw another provider and was diagnosed with a yeast infection and has taken 2 doses of this, after which the symptoms started.  She has tried over-the-counter medications without relief.  She denies abdominal pain, nausea, vomiting and diarrhea.  She denies vision changes or headache.  Review of Systems  Positive:  Negative:   Physical Exam  BP 118/70 (BP Location: Right Arm)   Pulse 71   Temp 98.6 F (37 C) (Oral)   Resp 16   LMP 07/04/2021 (Exact Date)   SpO2 100%  Gen:   Awake, no distress   Resp:  Normal effort lungs CTA bilaterally MSK:   Moves extremities without difficulty  Other:  Heart sounds normal, no reproducible chest wall tenderness  Medical Decision Making  Medically screening exam initiated at 8:27 PM.  Appropriate orders placed.  [name] was informed that the remainder of the evaluation will be completed by another provider, this initial triage assessment does not replace that evaluation, and the importance of remaining in the ED until their evaluation is complete.     Janell Quiet, [place] 07/24/21 2028

## 2021-08-06 DIAGNOSIS — Z6822 Body mass index (BMI) 22.0-22.9, adult: Secondary | ICD-10-CM | POA: Diagnosis not present

## 2021-08-06 DIAGNOSIS — Z7251 High risk heterosexual behavior: Secondary | ICD-10-CM | POA: Diagnosis not present

## 2021-08-11 DIAGNOSIS — J029 Acute pharyngitis, unspecified: Secondary | ICD-10-CM | POA: Diagnosis not present

## 2021-08-11 DIAGNOSIS — E559 Vitamin D deficiency, unspecified: Secondary | ICD-10-CM | POA: Diagnosis not present

## 2021-08-22 DIAGNOSIS — N898 Other specified noninflammatory disorders of vagina: Secondary | ICD-10-CM | POA: Diagnosis not present

## 2021-08-22 DIAGNOSIS — B9689 Other specified bacterial agents as the cause of diseases classified elsewhere: Secondary | ICD-10-CM | POA: Diagnosis not present

## 2021-08-22 DIAGNOSIS — N760 Acute vaginitis: Secondary | ICD-10-CM | POA: Diagnosis not present

## 2021-08-22 DIAGNOSIS — B3731 Acute candidiasis of vulva and vagina: Secondary | ICD-10-CM | POA: Diagnosis not present

## 2021-09-29 DIAGNOSIS — Z681 Body mass index (BMI) 19 or less, adult: Secondary | ICD-10-CM | POA: Diagnosis not present

## 2021-09-29 DIAGNOSIS — Z111 Encounter for screening for respiratory tuberculosis: Secondary | ICD-10-CM | POA: Diagnosis not present

## 2021-11-26 DIAGNOSIS — B9689 Other specified bacterial agents as the cause of diseases classified elsewhere: Secondary | ICD-10-CM | POA: Diagnosis not present

## 2021-11-26 DIAGNOSIS — R0982 Postnasal drip: Secondary | ICD-10-CM | POA: Diagnosis not present

## 2021-11-26 DIAGNOSIS — N898 Other specified noninflammatory disorders of vagina: Secondary | ICD-10-CM | POA: Diagnosis not present

## 2021-11-26 DIAGNOSIS — J029 Acute pharyngitis, unspecified: Secondary | ICD-10-CM | POA: Diagnosis not present

## 2021-11-26 DIAGNOSIS — N760 Acute vaginitis: Secondary | ICD-10-CM | POA: Diagnosis not present

## 2022-01-23 DIAGNOSIS — L049 Acute lymphadenitis, unspecified: Secondary | ICD-10-CM | POA: Diagnosis not present

## 2022-02-05 DIAGNOSIS — E559 Vitamin D deficiency, unspecified: Secondary | ICD-10-CM | POA: Diagnosis not present

## 2022-02-05 DIAGNOSIS — R5383 Other fatigue: Secondary | ICD-10-CM | POA: Diagnosis not present

## 2022-02-05 DIAGNOSIS — Z79899 Other long term (current) drug therapy: Secondary | ICD-10-CM | POA: Diagnosis not present

## 2022-02-05 DIAGNOSIS — Z1159 Encounter for screening for other viral diseases: Secondary | ICD-10-CM | POA: Diagnosis not present

## 2022-02-05 DIAGNOSIS — Z7251 High risk heterosexual behavior: Secondary | ICD-10-CM | POA: Diagnosis not present

## 2022-02-12 DIAGNOSIS — R0781 Pleurodynia: Secondary | ICD-10-CM | POA: Diagnosis not present

## 2022-02-12 DIAGNOSIS — R1084 Generalized abdominal pain: Secondary | ICD-10-CM | POA: Diagnosis not present

## 2022-03-14 ENCOUNTER — Other Ambulatory Visit (HOSPITAL_COMMUNITY)
Admission: RE | Admit: 2022-03-14 | Discharge: 2022-03-14 | Disposition: A | Discharge status: Home / Self Care | Payer: Medicaid Other | Source: Ambulatory Visit | Attending: Obstetrics and Gynecology | Admitting: Obstetrics and Gynecology

## 2022-03-14 ENCOUNTER — Ambulatory Visit: Payer: Medicaid Other | Admitting: Obstetrics and Gynecology

## 2022-03-14 ENCOUNTER — Encounter: Payer: Self-pay | Admitting: Obstetrics and Gynecology

## 2022-03-14 VITALS — BP 111/77 | HR 78 | Wt 124.0 lb

## 2022-03-14 DIAGNOSIS — R102 Pelvic and perineal pain: Secondary | ICD-10-CM | POA: Insufficient documentation

## 2022-03-14 DIAGNOSIS — Z124 Encounter for screening for malignant neoplasm of cervix: Secondary | ICD-10-CM | POA: Insufficient documentation

## 2022-03-14 LAB — URINALYSIS, ROUTINE W REFLEX MICROSCOPIC
Bilirubin, UA: NEGATIVE
Glucose, UA: NEGATIVE
Ketones, UA: NEGATIVE
Leukocytes,UA: NEGATIVE
Nitrite, UA: NEGATIVE
Protein,UA: NEGATIVE
RBC, UA: NEGATIVE
Specific Gravity, UA: 1.021 (ref 1.005–1.030)
Urobilinogen, Ur: 0.2 mg/dL (ref 0.2–1.0)
pH, UA: 6 (ref 5.0–7.5)

## 2022-03-14 LAB — POCT URINALYSIS DIPSTICK
Bilirubin, UA: NEGATIVE
Glucose, UA: NEGATIVE
Ketones, UA: NEGATIVE
Leukocytes, UA: NEGATIVE
Nitrite, UA: NEGATIVE
Protein, UA: NEGATIVE
Spec Grav, UA: >=1.03 — AB (ref 1.010–1.025)
Urobilinogen, UA: 0.2 E.U./dL
pH, UA: 6 (ref 5.0–8.0)

## 2022-03-14 LAB — POCT URINE PREGNANCY: Preg Test, Ur: NEGATIVE

## 2022-03-14 NOTE — Progress Notes (Signed)
RGYN pt here for problem visit.   Notes lower abdominal pain x 2 wks now and vaginal discharge.  Hx of recurrent BV.   Pap:02/19/17

## 2022-03-15 NOTE — Progress Notes (Signed)
Obstetrics and Gynecology Visit Return Patient Evaluation  Appointment Date: 03/14/2022  Primary Care Provider: Kathyrn Lass  OBGYN Clinic: [facility]  Chief Complaint: pelvic pain  History of Present Illness:  Samantha Knight is a 27 y.o. with above CC.  LMP 2/17 and this one was longer (about a week) and with the discomfort starting right before her period started and still having some lingering discomfort  Patient is not on anything for birth control/period control  Review of Systems: as noted in the History of Present Illness.  Patient Active Problem List   Diagnosis Date Noted   Pelvic pain 10/21/2017   Skin lesion 10/21/2017   BMI 30.0-30.9,adult 01/10/2017   Medications: None  Allergies: has No Known Allergies.  Physical Exam:  BP 111/77   Pulse 78   Wt 124 lb (56.2 kg)   LMP 03/02/2022   BMI 21.97 kg/m  Body mass index is 21.97 kg/m. General appearance: Well nourished, well developed female in no acute distress.  Abdomen: diffusely non tender to palpation, non distended, and no masses, hernias Back: nttp Pulmonary: no respiratory distress Neuro/Psych:  Normal mood and affect.    Pelvic exam:  Cervical exam performed in the presence of a chaperone EGBUS: normal Vaginal vault: normal Cervix:  normal Bimanual: negative  Labs: UPT negative, u/a negative (just trace blood)   Assessment: patient doing well  Plan:  1. Pelvic pain D/w her that could be symptomatic ovarian cyst but should resolve sometime with her next cycle. If pain gets worse or change in s/s, pt told to call us and can schedule an u/s; declines anything for birth or period control Patient requests vaginal swab for h/o recurrent BV; I told her that BV would cause her s/s.  - Cervicovaginal ancillary only( [place]) - POCT Urinalysis Dipstick - POCT urine pregnancy - Urine Culture - Urinalysis, Routine w reflex microscopic  2. Cervical cancer screening -  Cytology - PAP( [place])   RTC: PRN   Durene Romans MD Attending Center for [facility])

## 2022-03-16 LAB — URINE CULTURE

## 2022-03-18 LAB — CERVICOVAGINAL ANCILLARY ONLY
Bacterial Vaginitis (gardnerella): POSITIVE — AB
Candida Glabrata: NEGATIVE
Candida Vaginitis: NEGATIVE
Chlamydia: NEGATIVE
Comment: NEGATIVE
Comment: NEGATIVE
Comment: NEGATIVE
Comment: NEGATIVE
Comment: NEGATIVE
Comment: NORMAL
Neisseria Gonorrhea: NEGATIVE
Trichomonas: NEGATIVE

## 2022-03-20 LAB — CYTOLOGY - PAP
Comment: NEGATIVE
Diagnosis: UNDETERMINED — AB
High risk HPV: NEGATIVE

## 2022-03-21 ENCOUNTER — Other Ambulatory Visit: Payer: Self-pay | Admitting: *Deleted

## 2022-03-21 MED ORDER — EPOETIN ALFA-EPBX 40000 UNIT/ML IJ SOLN
INTRAMUSCULAR | Status: AC
  Filled 2022-03-21: qty 1

## 2022-03-21 MED ORDER — METRONIDAZOLE 500 MG PO TABS: 500.0000 mg | ORAL_TABLET | Freq: Two times a day (BID) | ORAL | 0 refills | Status: AC

## 2022-03-22 ENCOUNTER — Other Ambulatory Visit: Payer: Self-pay

## 2022-04-01 ENCOUNTER — Encounter: Payer: Self-pay | Admitting: Obstetrics and Gynecology

## 2022-05-09 ENCOUNTER — Other Ambulatory Visit (HOSPITAL_COMMUNITY)
Admission: RE | Admit: 2022-05-09 | Discharge: 2022-05-09 | Disposition: A | Discharge status: Home / Self Care | Payer: Medicaid Other | Source: Ambulatory Visit | Attending: Obstetrics & Gynecology | Admitting: Obstetrics & Gynecology

## 2022-05-09 ENCOUNTER — Encounter: Payer: Self-pay | Admitting: Obstetrics and Gynecology

## 2022-05-09 ENCOUNTER — Ambulatory Visit (INDEPENDENT_AMBULATORY_CARE_PROVIDER_SITE_OTHER): Payer: Medicaid Other | Admitting: *Deleted

## 2022-05-09 VITALS — BP 108/74 | HR 69 | Wt 130.0 lb

## 2022-05-09 DIAGNOSIS — N898 Other specified noninflammatory disorders of vagina: Secondary | ICD-10-CM | POA: Insufficient documentation

## 2022-05-09 MED ORDER — METRONIDAZOLE 500 MG PO TABS: 500.0000 mg | ORAL_TABLET | Freq: Two times a day (BID) | ORAL | 0 refills | Status: DC

## 2022-05-09 NOTE — Addendum Note (Signed)
Addended by: Scheryl Marten on: 05/09/2022 02:03 PM   Modules accepted: Orders

## 2022-05-09 NOTE — Progress Notes (Addendum)
SUBJECTIVE:  27 y.o. female complains of malodorous vaginal discharge for few day(s). Denies abnormal vaginal bleeding or significant pelvic pain or fever. No UTI symptoms. Denies history of known exposure to STD.  No LMP recorded.  OBJECTIVE:  She appears well, afebrile. Urine dipstick: not done.  ASSESSMENT:  Vaginal Discharge  Vaginal Odor   PLAN:  GC, chlamydia, trichomonas, BVAG, CVAG probe sent to lab. Treatment: Will send in Flagyl as patient believes it is BV.  Additional treatment to be determined once lab results are received ROV prn if symptoms persist or worsen.   Scheryl Marten, RN

## 2022-05-10 LAB — CERVICOVAGINAL ANCILLARY ONLY
Bacterial Vaginitis (gardnerella): POSITIVE — AB
Candida Glabrata: NEGATIVE
Candida Vaginitis: NEGATIVE
Chlamydia: NEGATIVE
Comment: NEGATIVE
Comment: NEGATIVE
Comment: NEGATIVE
Comment: NEGATIVE
Comment: NEGATIVE
Comment: NORMAL
Neisseria Gonorrhea: NEGATIVE
Trichomonas: NEGATIVE

## 2022-06-10 ENCOUNTER — Encounter: Payer: Self-pay | Admitting: Obstetrics and Gynecology

## 2022-06-13 ENCOUNTER — Ambulatory Visit (INDEPENDENT_AMBULATORY_CARE_PROVIDER_SITE_OTHER): Payer: Medicaid Other

## 2022-06-13 ENCOUNTER — Other Ambulatory Visit (HOSPITAL_COMMUNITY)
Admission: RE | Admit: 2022-06-13 | Discharge: 2022-06-13 | Disposition: A | Discharge status: Home / Self Care | Payer: Medicaid Other | Source: Ambulatory Visit | Attending: Advanced Practice Midwife | Admitting: Advanced Practice Midwife

## 2022-06-13 DIAGNOSIS — N898 Other specified noninflammatory disorders of vagina: Secondary | ICD-10-CM | POA: Diagnosis not present

## 2022-06-13 NOTE — Progress Notes (Signed)
SUBJECTIVE:  27 y.o. female complains of  vaginal discharge. Denies abnormal vaginal bleeding or significant pelvic pain or fever. No UTI symptoms. Denies history of known exposure to STD.  No LMP recorded.  OBJECTIVE:  She appears well, afebrile. Urine dipstick: not done.  ASSESSMENT:  Vaginal Discharge     PLAN:  GC, chlamydia, trichomonas, BVAG, CVAG probe sent to lab. Treatment: To be determined once lab results are received ROV prn if symptoms persist or worsen.

## 2022-06-13 NOTE — Progress Notes (Signed)
ATTESTATION OF SUPERVISION OF RN: Evaluation and management procedures were performed by the RN under my supervision and collaboration. I have reviewed the nursing note and chart and agree with the management and plan for this patient.  Mort Smelser, CNM  

## 2022-06-14 ENCOUNTER — Encounter: Payer: Self-pay | Admitting: Obstetrics and Gynecology

## 2022-06-14 LAB — CERVICOVAGINAL ANCILLARY ONLY
Bacterial Vaginitis (gardnerella): NEGATIVE
Candida Glabrata: NEGATIVE
Candida Vaginitis: NEGATIVE
Chlamydia: NEGATIVE
Comment: NEGATIVE
Comment: NEGATIVE
Comment: NEGATIVE
Comment: NEGATIVE
Comment: NEGATIVE
Comment: NORMAL
Neisseria Gonorrhea: NEGATIVE
Trichomonas: NEGATIVE

## 2022-06-27 ENCOUNTER — Ambulatory Visit: Payer: Medicaid Other | Admitting: Advanced Practice Midwife

## 2022-06-27 ENCOUNTER — Encounter: Payer: Self-pay | Admitting: Advanced Practice Midwife

## 2022-06-27 ENCOUNTER — Other Ambulatory Visit (HOSPITAL_COMMUNITY)
Admission: RE | Admit: 2022-06-27 | Discharge: 2022-06-27 | Disposition: A | Discharge status: Home / Self Care | Payer: Medicaid Other | Source: Ambulatory Visit | Attending: Obstetrics and Gynecology | Admitting: Obstetrics and Gynecology

## 2022-06-27 VITALS — BP 120/79 | HR 80

## 2022-06-27 DIAGNOSIS — Z113 Encounter for screening for infections with a predominantly sexual mode of transmission: Secondary | ICD-10-CM

## 2022-06-27 DIAGNOSIS — Z9189 Other specified personal risk factors, not elsewhere classified: Secondary | ICD-10-CM | POA: Diagnosis not present

## 2022-06-28 LAB — HEPATITIS C ANTIBODY: Hep C Virus Ab: NONREACTIVE

## 2022-06-28 LAB — RPR: RPR Ser Ql: NONREACTIVE

## 2022-06-28 LAB — CBC
Hematocrit: 36.7 % (ref 34.0–46.6)
Hemoglobin: 12.1 g/dL (ref 11.1–15.9)
MCH: 30.6 pg (ref 26.6–33.0)
MCHC: 33 g/dL (ref 31.5–35.7)
MCV: 93 fL (ref 79–97)
Platelets: 142 10*3/uL — ABNORMAL LOW (ref 150–450)
RBC: 3.95 x10E6/uL (ref 3.77–5.28)
RDW: 14.1 % (ref 11.7–15.4)
WBC: 4.3 10*3/uL (ref 3.4–10.8)

## 2022-06-28 LAB — HEPATITIS B SURFACE ANTIGEN: Hepatitis B Surface Ag: NEGATIVE

## 2022-06-28 LAB — HIV ANTIBODY (ROUTINE TESTING W REFLEX): HIV Screen 4th Generation wRfx: NONREACTIVE
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
## 2022-07-01 LAB — CERVICOVAGINAL ANCILLARY ONLY
Bacterial Vaginitis (gardnerella): NEGATIVE
Candida Glabrata: NEGATIVE
Candida Vaginitis: NEGATIVE
Chlamydia: NEGATIVE
Comment: NEGATIVE
Comment: NEGATIVE
Comment: NEGATIVE
Comment: NEGATIVE
Comment: NEGATIVE
Comment: NORMAL
Neisseria Gonorrhea: NEGATIVE
Trichomonas: NEGATIVE

## 2022-07-15 ENCOUNTER — Ambulatory Visit: Payer: Medicaid Other | Admitting: Obstetrics and Gynecology

## 2022-09-27 ENCOUNTER — Encounter: Payer: Self-pay | Admitting: Internal Medicine

## 2022-09-27 ENCOUNTER — Ambulatory Visit (INDEPENDENT_AMBULATORY_CARE_PROVIDER_SITE_OTHER): Payer: Medicaid Other | Admitting: Internal Medicine

## 2022-09-27 VITALS — BP 120/78 | HR 86 | Temp 97.7°F | Ht 63.0 in | Wt 128.2 lb

## 2022-09-27 DIAGNOSIS — Z9109 Other allergy status, other than to drugs and biological substances: Secondary | ICD-10-CM | POA: Diagnosis not present

## 2022-09-27 DIAGNOSIS — J4520 Mild intermittent asthma, uncomplicated: Secondary | ICD-10-CM

## 2022-09-27 DIAGNOSIS — Z13220 Encounter for screening for lipoid disorders: Secondary | ICD-10-CM | POA: Diagnosis not present

## 2022-09-27 DIAGNOSIS — R634 Abnormal weight loss: Secondary | ICD-10-CM

## 2022-09-27 NOTE — Progress Notes (Signed)
New Patient Office Visit  Subjective    Patient ID: Samantha Knight, female    DOB: November 19, 1995  Age: 27 y.o. MRN: [id number]  CC:  Chief Complaint  Patient presents with   Establish Care    Specialist: OBGYN    HPI Samantha Knight presents to establish care.   Asthma/Environmental Allergies:  -Asthma status: stable -Current Treatments: Albuterol PRN -Satisfied with current treatment?: yes -Albuterol/rescue inhaler frequency: Rarely  -Dyspnea frequency: None -Wheezing frequency: Rare -Cough frequency: Usually not often but does have a dry cough, clear rhinorrhea currently  -Nocturnal symptom frequency: None -Limitation of activity: no -Current upper respiratory symptoms: yes -Visits to ER or Urgent Care in past year: no -Pneumovax: Not up to Date -Influenza: Not up to Date  Difficulty Gaining Weight: -Weight ranging from 124-128 pounds, unable to gain weight  -Does not eat breakfast, eats something small for lunch and eats full meal at dinner. Doesn't usually snack but tried having a milkshake every night and cannot gain weight -Does not work out but very active with her small children -Interested in discussing appetite stimulant   Health Maintenance: -Blood work due -Pap 2/24 ASCUS, following with Gynecology   Outpatient Encounter Medications as of 09/27/2022  Medication Sig   albuterol (VENTOLIN HFA) 108 (90 Base) MCG/ACT inhaler Inhale 1-2 puffs into the lungs every 6 (six) hours as needed for up to 10 days for wheezing or shortness of breath.   No facility-administered encounter medications on file as of 09/27/2022.    Past Medical History:  Diagnosis Date   Asthma    as a child.    Fundal height low for dates in third trimester 05/10/2019   s<d-->4/27 efw 47%, AC 56%   Migraine     Past Surgical History:  Procedure Laterality Date   NO PAST SURGERIES      Family History  Problem Relation Age of Onset   Asthma Son    Autism Son    Cancer  Maternal Aunt        Breast   Cancer Maternal Grandmother        Kindey   Diabetes Neg Hx    Heart disease Neg Hx    Stroke Neg Hx     Social History   Socioeconomic History   Marital status: Married    Spouse name: Corporate investment banker   Number of children: 1   Years of education: Not on file   Highest education level: Not on file  Occupational History   Not on file  Tobacco Use   Smoking status: Every Day    Types: Cigars   Smokeless tobacco: Never   Tobacco comments:    quit with +UPT  Vaping Use   Vaping status: Never Used  Substance and Sexual Activity   Alcohol use: Yes    Alcohol/week: 1.0 standard drink of alcohol    Types: 1 Glasses of wine per week   Drug use: Not Currently    Types: Marijuana   Sexual activity: Not Currently    Birth control/protection: None  Other Topics Concern   Not on file  Social History Narrative   Not on file   Social Determinants of Health   Financial Resource Strain: Not on file  Food Insecurity: Not on file  Transportation Needs: Not on file  Physical Activity: Not on file  Stress: Not on file  Social Connections: Not on file  Intimate Partner Violence: Not on file    Review of Systems  Constitutional:  Negative for chills, fever and weight loss.  HENT:  Positive for congestion. Negative for sore throat.   Eyes:  Negative for blurred vision.  Respiratory:  Positive for cough. Negative for sputum production, shortness of breath and wheezing.   Cardiovascular:  Negative for chest pain.  Skin:  Positive for itching and rash.        Objective    BP 120/78   Pulse 86   Temp 97.7 F (36.5 C)   Ht 5\' 3"  (1.6 m)   Wt 128 lb 3.2 oz (58.2 kg)   LMP 09/24/2022 (Approximate)   SpO2 100%   Breastfeeding No   BMI 22.71 kg/m   Physical Exam Constitutional:      Appearance: Normal appearance.  HENT:     Head: Normocephalic and atraumatic.     Right Ear: Tympanic membrane, ear canal and external ear normal.     Left Ear:  Tympanic membrane, ear canal and external ear normal.     Nose: Nose normal.     Mouth/Throat:     Mouth: Mucous membranes are moist.     Comments: Mild PND Eyes:     Extraocular Movements: Extraocular movements intact.     Conjunctiva/sclera: Conjunctivae normal.     Pupils: Pupils are equal, round, and reactive to light.  Neck:     Comments: No thyromegaly  Cardiovascular:     Rate and Rhythm: Normal rate and regular rhythm.  Pulmonary:     Effort: Pulmonary effort is normal.     Breath sounds: Normal breath sounds.  Musculoskeletal:     Cervical back: No tenderness.  Lymphadenopathy:     Cervical: No cervical adenopathy.  Skin:    General: Skin is warm and dry.  Neurological:     General: No focal deficit present.     Mental Status: She is alert. Mental status is at baseline.  Psychiatric:        Mood and Affect: Mood normal.        Behavior: Behavior normal.     Last CBC Lab Results  Component Value Date   WBC 4.3 06/27/2022   HGB 12.1 06/27/2022   HCT 36.7 06/27/2022   MCV 93 06/27/2022   MCH 30.6 06/27/2022   RDW 14.1 06/27/2022   PLT 142 (L) 06/27/2022   Last metabolic panel Lab Results  Component Value Date   GLUCOSE 90 07/24/2021   NA 137 07/24/2021   K 5.1 07/24/2021   CL 107 07/24/2021   CO2 22 07/24/2021   BUN 8 07/24/2021   CREATININE 0.75 07/24/2021   GFRNONAA >60 07/24/2021   CALCIUM 9.3 07/24/2021   PROT 6.7 05/15/2021   ALBUMIN 4.1 05/15/2021   BILITOT 0.5 05/15/2021   ALKPHOS 34 (L) 05/15/2021   AST 36 05/15/2021   ALT 15 05/15/2021   ANIONGAP 8 07/24/2021   Last lipids No results found for: "CHOL", "HDL", "LDLCALC", "LDLDIRECT", "TRIG", "CHOLHDL" Last hemoglobin A1c Lab Results  Component Value Date   HGBA1C 4.9 11/17/2018   Last thyroid functions No results found for: "TSH", "T3TOTAL", "T4TOTAL", "THYROIDAB" Last vitamin D No results found for: "25OHVITD2", "25OHVITD3", "VD25OH" Last vitamin B12 and Folate No results found  for: "VITAMINB12", "FOLATE"      Assessment & Plan:   1. Mild intermittent asthma without complication/Environmental allergies: Asthma stable, discussed getting flu vaccine today, patient declines. Using Albuterol as needed. Discussed starting a daily anti-histamine for allergy symptom control. Can also use over the counter Benadryl cream for  rash.  - CBC w/Diff/Platelet - COMPLETE METABOLIC PANEL WITH GFR  2. Weight loss: Unable to gain weight, will check thyroid function today. Briefly discussed appetite stimulant which I don't think needs at this point, BMI stable. Info printed about Remeron.  - TSH  4. Lipid screening: Check lipid panel.  - Lipid Profile   Return in about 1 year (around 09/27/2023).   Margarita Mail, DO

## 2022-09-27 NOTE — Patient Instructions (Signed)
It was great seeing you today!  Plan discussed at today's visit: -Blood work ordered today, results will be uploaded to MyChart.  -Recommend starting an oral anti-histamine like Zyrtec, Xyzal, Allegra or Claritin over the counter -Also recommend topical Benadryl cream for itchy rash -Consider flu vaccine -Info about medication that can increase appetite below    Follow up in: 1 year   Take care and let us know if you have any questions or concerns prior to your next visit.  Dr. Caralee Ates  Mirtazapine Tablets What is this medication? MIRTAZAPINE (mir TAZ a peen) treats depression. It increases the amount of serotonin and norepinephrine in the brain, hormones that help regulate mood. This medicine may be used for other purposes; ask your health care provider or pharmacist if you have questions. COMMON BRAND NAME(S): Remeron What should I tell my care team before I take this medication? They need to know if you have any of these conditions: Bipolar disorder Glaucoma Kidney disease Liver disease Suicidal thoughts An unusual or allergic reaction to mirtazapine, other medications, foods, dyes, or preservatives Pregnant or trying to get pregnant Breast-feeding How should I use this medication? Take this medication by mouth with a glass of water. Follow the directions on the prescription label. Take your medication at regular intervals. Do not take your medication more often than directed. Do not stop taking this medication suddenly except upon the advice of your care team. Stopping this medication too quickly may cause serious side effects or your condition may worsen. A special MedGuide will be given to you by the pharmacist with each prescription and refill. Be sure to read this information carefully each time. Talk to your care team about the use of this medication in children. Special care may be needed. Overdosage: If you think you have taken too much of this medicine contact a poison  control center or emergency room at once. NOTE: This medicine is only for you. Do not share this medicine with others. What if I miss a dose? If you miss a dose, take it as soon as you can. If it is almost time for your next dose, take only that dose. Do not take double or extra doses. What may interact with this medication? Do not take this medication with any of the following: Linezolid MAOIs, such as Carbex, Eldepryl, Marplan, Nardil, and Parnate Methylene blue (injected into a vein) This medication may also interact with the following: Alcohol Antivirals for HIV or AIDS Certain medications that treat or prevent blood clots, such as warfarin Certain medications for fungal infections, such as ketoconazole and itraconazole Certain medications for mental health conditions Certain medications for migraine headache, such as almotriptan, eletriptan, frovatriptan, naratriptan, rizatriptan, sumatriptan, zolmitriptan Certain medications for seizures, such as carbamazepine or phenytoin Certain medications for sleep Cimetidine Erythromycin Fentanyl Lithium Medications for blood pressure Nefazodone Rasagiline Rifampin Supplements, such as St. John's wort, kava kava, valerian Tramadol Tryptophan This list may not describe all possible interactions. Give your health care provider a list of all the medicines, herbs, non-prescription drugs, or dietary supplements you use. Also tell them if you smoke, drink alcohol, or use illegal drugs. Some items may interact with your medicine. What should I watch for while using this medication? Visit your care team for regular checks on your progress. It may be some time before you see the benefit from this medication. This medication may cause thoughts of suicide or depression. This includes sudden changes in mood, behaviors, or thoughts. These changes can happen  at any time but are more common in the beginning of treatment or after a change in dose. Call  your care team right away if you experience these thoughts or worsening depression. This medication may affect your coordination, reaction time, or judgment. Do not drive or operate machinery until you know how this medication affects you. Sit up or stand slowly to reduce the risk of dizzy or fainting spells. Drinking alcohol with this medication can increase the risk of these side effects. This medication may cause dry eyes and blurred vision. If you wear contact lenses, you may feel some discomfort. Lubricating eye drops may help. See your care team if the problem does not go away or is severe. Your mouth may get dry. Chewing sugarless gum or sucking hard candy and drinking plenty of water may help. Contact your care team if the problem does not go away or is severe. What side effects may I notice from receiving this medication? Side effects that you should report to your care team as soon as possible: Allergic reactions--skin rash, itching, hives, swelling of the face, lips, tongue, or throat Heart rhythm changes--fast or irregular heartbeat, dizziness, feeling faint or lightheaded, chest pain, trouble breathing Infection--fever, chills, cough, or sore throat Irritability, confusion, fast or irregular heartbeat, muscle stiffness, twitching muscles, sweating, high fever, seizure, chills, vomiting, diarrhea, which may be signs of serotonin syndrome Low sodium level--muscle weakness, fatigue, dizziness, headache, confusion Rash, fever, and swollen lymph nodes Redness, blistering, peeling or loosening of the skin, including inside the mouth Seizures Sudden eye pain or change in vision such as blurry vision, seeing halos around lights, vision loss Thoughts of suicide or self-harm, worsening mood, feelings of depression Side effects that usually do not require medical attention (report to your care team if they continue or are bothersome): Constipation Dizziness Drowsiness Dry mouth Increase in  appetite Weight gain This list may not describe all possible side effects. Call your doctor for medical advice about side effects. You may report side effects to FDA at 1-800-FDA-1088. Where should I keep my medication? Keep out of the reach of children. Store at room temperature between 15 and 30 degrees C (59 and 86 degrees F) Protect from light and moisture. Throw away any unused medication after the expiration date. NOTE: This sheet is a summary. It may not cover all possible information. If you have questions about this medicine, talk to your doctor, pharmacist, or health care provider.  2024 Elsevier/Gold Standard (2021-08-29 00:00:00)

## 2022-09-28 LAB — COMPLETE METABOLIC PANEL WITH GFR
AG Ratio: 1.8 (calc) (ref 1.0–2.5)
ALT: 7 U/L (ref 6–29)
AST: 10 U/L (ref 10–30)
Albumin: 4.3 g/dL (ref 3.6–5.1)
Alkaline phosphatase (APISO): 38 U/L (ref 31–125)
BUN: 7 mg/dL (ref 7–25)
CO2: 25 mmol/L (ref 20–32)
Calcium: 8.9 mg/dL (ref 8.6–10.2)
Chloride: 107 mmol/L (ref 98–110)
Creat: 0.73 mg/dL (ref 0.50–0.96)
Globulin: 2.4 g/dL (ref 1.9–3.7)
Glucose, Bld: 72 mg/dL (ref 65–99)
Potassium: 4.2 mmol/L (ref 3.5–5.3)
Sodium: 139 mmol/L (ref 135–146)
Total Bilirubin: 0.3 mg/dL (ref 0.2–1.2)
Total Protein: 6.7 g/dL (ref 6.1–8.1)
eGFR: 116 mL/min/{1.73_m2} (ref >=60)

## 2022-09-28 LAB — CBC WITH DIFFERENTIAL/PLATELET
Absolute Monocytes: 419 {cells}/uL (ref 200–950)
Basophils Absolute: 18 {cells}/uL (ref 0–200)
Basophils Relative: 0.4 %
Eosinophils Absolute: 179 {cells}/uL (ref 15–500)
Eosinophils Relative: 3.9 %
HCT: 33.5 % — ABNORMAL LOW (ref 35.0–45.0)
Hemoglobin: 10.9 g/dL — ABNORMAL LOW (ref 11.7–15.5)
Lymphs Abs: 1357 {cells}/uL (ref 850–3900)
MCH: 30.9 pg (ref 27.0–33.0)
MCHC: 32.5 g/dL (ref 32.0–36.0)
MCV: 94.9 fL (ref 80.0–100.0)
MPV: 10.7 fL (ref 7.5–12.5)
Monocytes Relative: 9.1 %
Neutro Abs: 2627 {cells}/uL (ref 1500–7800)
Neutrophils Relative %: 57.1 %
Platelets: 179 10*3/uL (ref 140–400)
RBC: 3.53 10*6/uL — ABNORMAL LOW (ref 3.80–5.10)
RDW: 12.9 % (ref 11.0–15.0)
Total Lymphocyte: 29.5 %
WBC: 4.6 10*3/uL (ref 3.8–10.8)

## 2022-09-28 LAB — LIPID PANEL
Cholesterol: 115 mg/dL (ref <200)
HDL: 50 mg/dL (ref >=50)
LDL Cholesterol (Calc): 52 mg/dL
Non-HDL Cholesterol (Calc): 65 mg/dL (ref <130)
Total CHOL/HDL Ratio: 2.3 (calc) (ref <5.0)
Triglycerides: 47 mg/dL (ref <150)

## 2022-09-28 LAB — TSH: TSH: 0.63 m[IU]/L

## 2022-10-14 ENCOUNTER — Encounter: Payer: Self-pay | Admitting: Obstetrics and Gynecology

## 2022-11-04 ENCOUNTER — Ambulatory Visit: Payer: Self-pay

## 2022-11-04 ENCOUNTER — Ambulatory Visit: Payer: Medicaid Other | Admitting: Family Medicine

## 2022-11-04 NOTE — Telephone Encounter (Signed)
  Chief Complaint: feels like something down deep in throat Symptoms: sore throat 6/10, cold sx Frequency: yesterday  Pertinent Negatives: Patient denies earache, uvula swelling rash Disposition: [] ED /[] Urgent Care (no appt availability in office) / [x] Appointment(In office/virtual)/ []  [facility]/ [] Home Care/ [] Refused Recommended Disposition /[] [facility]/ []  Follow-up with PCP Additional Notes: advised if worsens go to UC or ED Reason for Disposition  SEVERE (e.g., excruciating) throat pain    moderate  Answer Assessment - Initial Assessment Questions 1. ONSET: "When did the throat start hurting?" (Hours or days ago)      Yesterday  2. SEVERITY: "How bad is the sore throat?" (Scale 1-10; mild, moderate or severe)   - MILD (1-3):  Doesn't interfere with eating or normal activities.   - MODERATE (4-7): Interferes with eating some solids and normal activities.   - SEVERE (8-10):  Excruciating pain, interferes with most normal activities.   - SEVERE WITH DYSPHAGIA (10): Can't swallow liquids, drooling.     mild 3. STREP EXPOSURE: "Has there been any exposure to strep within the past week?" If Yes, ask: "What type of contact occurred?"      yes 4.  VIRAL SYMPTOMS: "Are there any symptoms of a cold, such as a runny nose, cough, hoarse voice or red eyes?"      Cold cough sneezing sore throat 5. FEVER: "Do you have a fever?" If Yes, ask: "What is your temperature, how was it measured, and when did it start?"     no 6. PUS ON THE TONSILS: "Is there pus on the tonsils in the back of your throat?"     no 7. OTHER SYMPTOMS: "Do you have any other symptoms?" (e.g., difficulty breathing, headache, rash)     Swallowing feels like some down in thrpat 8. PREGNANCY: "Is there any chance you are pregnant?" "When was your last menstrual period?"     *No Answer*  Protocols used: Sore Throat-A-AH

## 2022-11-24 ENCOUNTER — Emergency Department (HOSPITAL_BASED_OUTPATIENT_CLINIC_OR_DEPARTMENT_OTHER)
Admission: EM | Admit: 2022-11-24 | Discharge: 2022-11-24 | Discharge status: Against Medical Advice | Payer: Medicaid Other | Attending: Emergency Medicine | Admitting: Emergency Medicine

## 2022-11-24 ENCOUNTER — Other Ambulatory Visit: Payer: Self-pay

## 2022-11-24 ENCOUNTER — Encounter (HOSPITAL_BASED_OUTPATIENT_CLINIC_OR_DEPARTMENT_OTHER): Payer: Self-pay | Admitting: Emergency Medicine

## 2022-11-24 DIAGNOSIS — Z5321 Procedure and treatment not carried out due to patient leaving prior to being seen by health care provider: Secondary | ICD-10-CM | POA: Insufficient documentation

## 2022-11-24 DIAGNOSIS — B360 Pityriasis versicolor: Secondary | ICD-10-CM | POA: Diagnosis not present

## 2022-11-24 DIAGNOSIS — R202 Paresthesia of skin: Secondary | ICD-10-CM | POA: Insufficient documentation

## 2022-11-24 DIAGNOSIS — R21 Rash and other nonspecific skin eruption: Secondary | ICD-10-CM | POA: Diagnosis not present

## 2022-11-24 NOTE — ED Triage Notes (Signed)
Noticed dark spots with tingling on face.x 1 month.

## 2022-12-03 ENCOUNTER — Ambulatory Visit (INDEPENDENT_AMBULATORY_CARE_PROVIDER_SITE_OTHER): Payer: Medicaid Other | Admitting: Physician Assistant

## 2022-12-03 ENCOUNTER — Encounter: Payer: Self-pay | Admitting: Physician Assistant

## 2022-12-03 VITALS — BP 104/64 | HR 100 | Temp 97.7°F | Resp 16 | Ht 63.0 in | Wt 123.3 lb

## 2022-12-03 DIAGNOSIS — L989 Disorder of the skin and subcutaneous tissue, unspecified: Secondary | ICD-10-CM

## 2022-12-03 DIAGNOSIS — J069 Acute upper respiratory infection, unspecified: Secondary | ICD-10-CM | POA: Diagnosis not present

## 2022-12-03 DIAGNOSIS — J9801 Acute bronchospasm: Secondary | ICD-10-CM | POA: Diagnosis not present

## 2022-12-03 MED ORDER — BENZONATATE 100 MG PO CAPS: 100.0000 mg | ORAL_CAPSULE | Freq: Two times a day (BID) | ORAL | 0 refills | Status: DC | PRN

## 2022-12-03 MED ORDER — PREDNISONE 20 MG PO TABS: ORAL_TABLET | ORAL | 0 refills | Status: DC

## 2022-12-03 MED ORDER — ALBUTEROL SULFATE HFA 108 (90 BASE) MCG/ACT IN AERS: 1.0000 | INHALATION_SPRAY | Freq: Four times a day (QID) | RESPIRATORY_TRACT | 2 refills | Status: AC | PRN

## 2022-12-03 NOTE — Assessment & Plan Note (Addendum)
Unsure of chronicity  She reports being seen at [facility] for this - reports circular lesions on cheeks along with red bumps under eyes Unable to see UC charts or visit notes but patient does have medications with her in office today  Recommend she continues with Selenium topical treatment and stops using steroid cream for now to prevent irritation  Follow up as needed for persistent or progressing symptoms

## 2022-12-03 NOTE — Patient Instructions (Signed)
  Based on your described symptoms and the duration of symptoms it is likely that you have a viral upper respiratory infection (often called a "cold")  Symptoms can last for 3-10 days with lingering cough and intermittent symptoms lasting weeks after that.  The goal of treatment at this time is to reduce your symptoms and discomfort   I have sent in Tessalon pearls for you to take twice per day to help with your cough  I have sent in a script for Prednisone taper to be taken in the morning with breakfast per the instructions on the container Remember that steroids can cause sleeplessness, irritability, increased hunger and elevated glucose levels so be mindful of these side effects. They should lessen as you progress to the lower doses of the taper.   You can use over the counter medications such as Dayquil/Nyquil, AlkaSeltzer formulations, etc to provide further relief of symptoms according to the manufacturer's instructions    If your symptoms do not improve or become worse in the next 5-7 days please make an apt at the office so we can see you  Go to the ER if you begin to have more serious symptoms such as shortness of breath, trouble breathing, loss of consciousness, swelling around the eyes, high fever, severe lasting headaches, vision changes or neck pain/stiffness.   Please continue to use the Selenium wash and use the topical cream as needed for itching. Try not to use this for more than 2 weeks consecutively

## 2022-12-03 NOTE — Progress Notes (Signed)
Acute Office Visit   Patient: Samantha Knight   DOB: [birth year]   27 y.o. Female  MRN: [id number] Visit Date: 12/03/2022  Today's healthcare provider: Oswaldo Conroy Lucille Crichlow, PA-C  Introduced myself to the patient as a Secondary school teacher and provided education on APPs in clinical practice.    Chief Complaint  Patient presents with   Cough    Onset for over a week, getting worst   Subjective    HPI HPI     Cough    Additional comments: Onset for over a week, getting worst      Last edited by Forde Radon, CMA on 12/03/2022 11:31 AM.      URI -type symptoms   Onset:  gradual, started about a week ago with asthma flare symptoms  Duration: about a week with evolving symptoms Associated symptoms: nausea and vomiting this AM, dry coughing started 2 days ago  She reports sinus pain and pressure have resolved   Intervention: Dayquil, used an albuterol inhaler   Recent sick contacts: her children have been sick recently but have started to feel better  Recent travel: none  COVID testing at home: Has not tested   Result: NA  She has concerns for red spots along her undereye areas and some faint circles along cheeks  She was seen for this previously in UC- has been using topicals- selenium sulfide and topical cream     Medications: Outpatient Medications Prior to Visit  Medication Sig   [DISCONTINUED] albuterol (VENTOLIN HFA) 108 (90 Base) MCG/ACT inhaler Inhale 1-2 puffs into the lungs every 6 (six) hours as needed for up to 10 days for wheezing or shortness of breath.   No facility-administered medications prior to visit.    Review of Systems  Constitutional:  Positive for chills and fatigue. Negative for fever.  HENT:  Negative for congestion, ear pain, sinus pressure, sinus pain and sore throat.   Respiratory:  Positive for cough, chest tightness, shortness of breath and wheezing.   Gastrointestinal:  Positive for nausea and vomiting. Negative for diarrhea.   Musculoskeletal:  Positive for myalgias.  Neurological:  Positive for dizziness and headaches.         Objective    BP 104/64   Pulse 100   Temp 97.7 F (36.5 C) (Temporal)   Resp 16   Ht 5\' 3"  (1.6 m)   Wt 123 lb 4.8 oz (55.9 kg)   LMP 11/22/2022   SpO2 98%   BMI 21.84 kg/m      Physical Exam Vitals reviewed.  Constitutional:      General: She is awake.     Appearance: Normal appearance. She is well-developed and well-groomed.  HENT:     Head: Normocephalic and atraumatic.     Right Ear: Hearing and ear canal normal. Tympanic membrane is bulging. Tympanic membrane is not injected, scarred, perforated or erythematous.     Left Ear: Hearing and ear canal normal. Tympanic membrane is bulging. Tympanic membrane is not injected, scarred, perforated or erythematous.     Mouth/Throat:     Lips: Pink.     Mouth: Mucous membranes are moist.     Pharynx: Uvula midline. No pharyngeal swelling, oropharyngeal exudate, posterior oropharyngeal erythema or uvula swelling.  Cardiovascular:     Rate and Rhythm: Normal rate and regular rhythm.     Pulses: Normal pulses.          Radial pulses are 2+ on the right side  and 2+ on the left side.     Heart sounds: Normal heart sounds. No murmur heard.    No friction rub. No gallop.  Pulmonary:     Effort: Pulmonary effort is normal.     Breath sounds: Normal breath sounds. Decreased air movement present. No decreased breath sounds, wheezing, rhonchi or rales.  Lymphadenopathy:     Head:     Right side of head: No submental, submandibular or preauricular adenopathy.     Left side of head: No submental, submandibular or preauricular adenopathy.     Cervical:     Right cervical: No superficial cervical adenopathy.    Left cervical: No superficial cervical adenopathy.     Upper Body:     Right upper body: No supraclavicular adenopathy.     Left upper body: No supraclavicular adenopathy.  Neurological:     Mental Status: She is alert.   Psychiatric:        Behavior: Behavior is cooperative.       No results found for any visits on 12/03/22.  Assessment & Plan      No follow-ups on file.     Problem List Items Addressed This Visit       Musculoskeletal and Integument   Skin lesion    Unsure of chronicity  She reports being seen at [facility] for this - reports circular lesions on cheeks along with red bumps under eyes Unable to see UC charts or visit notes but patient does have medications with her in office today  Recommend she continues with Selenium topical treatment and stops using steroid cream for now to prevent irritation  Follow up as needed for persistent or progressing symptoms        Other Visit Diagnoses     Viral upper respiratory tract infection    -  Primary   Cough due to bronchospasm       Relevant Medications   predniSONE (DELTASONE) 20 MG tablet   albuterol (VENTOLIN HFA) 108 (90 Base) MCG/ACT inhaler   benzonatate (TESSALON) 100 MG capsule      Visit with patient indicates symptoms comprised of dry cough, nausea, vomiting, nasal congestion since last week congruent with acute URI that is likely viral in nature  I suspect viral URI has triggered bronchospasm resulting in current cough Patient is outside therapeutic window for antivirals- no flu or COVID testing performed today.  Due to nature and duration of symptoms recommended treatment regimen is symptomatic relief and follow up if needed Will provide Prednisone taper and refill her rescue inhaler  Tessalon pearls provided to further assist with cough  Discussed with patient the various viral and bacterial etiologies of current illness and appropriate course of treatment Discussed OTC medication options for multisymptom relief such as Dayquil/Nyquil, Theraflu, AlkaSeltzer, etc. Discussed return precautions if symptoms are not improving or worsen over next 5-7 days.     No follow-ups on file.   I, Keefer Soulliere E Ger Ringenberg, PA-C, have reviewed  all documentation for this visit. The documentation on 12/03/22 for the exam, diagnosis, procedures, and orders are all accurate and complete.   Jacquelin Hawking, MHS, PA-C [facility]

## 2023-01-12 ENCOUNTER — Encounter: Payer: Self-pay | Admitting: Internal Medicine

## 2023-01-13 DIAGNOSIS — Z113 Encounter for screening for infections with a predominantly sexual mode of transmission: Secondary | ICD-10-CM | POA: Diagnosis not present

## 2023-01-13 DIAGNOSIS — T148XXA Other injury of unspecified body region, initial encounter: Secondary | ICD-10-CM | POA: Diagnosis not present

## 2023-01-13 DIAGNOSIS — M130 Polyarthritis, unspecified: Secondary | ICD-10-CM | POA: Diagnosis not present

## 2023-01-15 LAB — LAB REPORT - SCANNED
EGFR: 101
HM HIV Screening: NEGATIVE

## 2023-01-16 ENCOUNTER — Ambulatory Visit: Payer: Medicaid Other | Admitting: Internal Medicine

## 2023-01-16 ENCOUNTER — Encounter: Payer: Self-pay | Admitting: Family Medicine

## 2023-01-16 ENCOUNTER — Ambulatory Visit: Payer: Medicaid Other | Admitting: Family Medicine

## 2023-01-16 VITALS — BP 120/78 | HR 100 | Resp 16 | Ht 63.0 in | Wt 125.3 lb

## 2023-01-16 DIAGNOSIS — R233 Spontaneous ecchymoses: Secondary | ICD-10-CM | POA: Diagnosis not present

## 2023-01-16 DIAGNOSIS — D649 Anemia, unspecified: Secondary | ICD-10-CM

## 2023-01-16 DIAGNOSIS — F411 Generalized anxiety disorder: Secondary | ICD-10-CM

## 2023-01-16 DIAGNOSIS — M545 Low back pain, unspecified: Secondary | ICD-10-CM

## 2023-01-16 DIAGNOSIS — M791 Myalgia, unspecified site: Secondary | ICD-10-CM | POA: Diagnosis not present

## 2023-01-16 DIAGNOSIS — F341 Dysthymic disorder: Secondary | ICD-10-CM

## 2023-01-16 DIAGNOSIS — R1032 Left lower quadrant pain: Secondary | ICD-10-CM

## 2023-01-16 DIAGNOSIS — M2550 Pain in unspecified joint: Secondary | ICD-10-CM

## 2023-01-16 DIAGNOSIS — R52 Pain, unspecified: Secondary | ICD-10-CM

## 2023-01-16 DIAGNOSIS — M542 Cervicalgia: Secondary | ICD-10-CM

## 2023-01-16 MED ORDER — CELECOXIB 100 MG PO CAPS: 100.0000 mg | ORAL_CAPSULE | Freq: Two times a day (BID) | ORAL | 0 refills | Status: DC

## 2023-01-16 MED ORDER — BUSPIRONE HCL 5 MG PO TABS: 5.0000 mg | ORAL_TABLET | Freq: Two times a day (BID) | ORAL | 0 refills | Status: DC

## 2023-01-16 MED ORDER — DULOXETINE HCL 30 MG PO CPEP: 30.0000 mg | ORAL_CAPSULE | Freq: Every day | ORAL | 0 refills | Status: AC

## 2023-01-16 NOTE — Telephone Encounter (Signed)
 Pt has appt w Dr.Sowles today

## 2023-01-16 NOTE — Progress Notes (Signed)
 Name: Samantha Knight   MRN: [id number]    DOB: [birth year]   Date:01/16/2023       Progress Note  Subjective  Chief Complaint  Chief Complaint  Patient presents with   Pain    All over body onset for months, mainly bilateral legs and arms/hands. Legs are bruised. Went to UC    HPI  Discussed the use of AI scribe software for clinical note transcription with the patient, who gave verbal consent to proceed.  History of Present Illness   The patient, with a history of asthma and anemia, presented with a complex constellation of symptoms that have been progressively worsening over the past two months. The primary complaint was a persistent sharp pain in the left groin area, which was particularly bothersome during walking and sitting. This pain was accompanied by a sensation of numbness and tingling in both legs, with the left leg being more severely affected. The patient also reported experiencing joint pain in the hands, arms, and elbows, and described a general feeling of body aches.  In addition to these musculoskeletal symptoms, the patient noted the recent onset of easy bruising on both arms and legs. The patient denied any history of trauma or injury that could explain these bruises. The patient also reported intermittent back and neck pain, which had been present for a couple of weeks. The patient expressed significant health anxiety, particularly around the possibility of having contracted HIV, despite no specific symptoms suggestive of this condition.  The patient's medical history includes asthma, for which she uses an inhaler as needed, and anemia, for which she was previously advised to take iron supplements. The patient also reported a family history of gout and arthritis in a grandparent. The patient denied any recent changes in medication, and no new medications were reported at the time of the consultation. The patient's lifestyle involves significant physical activity, including  caring for young children and working a job that requires sitting from eight to five-thirty.  The patient had recently sought care at an urgent care center, where a comprehensive set of tests, including a complete blood count and tests for sexually transmitted diseases, were performed. The results of these tests were not available at the time of the consultation. The patient expressed significant anxiety about the results of these tests and the potential implications for her health.         Patient Active Problem List   Diagnosis Date Noted   Pelvic pain 10/21/2017   Skin lesion 10/21/2017   BMI 30.0-30.9,adult 01/10/2017    Past Surgical History:  Procedure Laterality Date   NO PAST SURGERIES      Family History  Problem Relation Age of Onset   Asthma Son    Autism Son    Cancer Maternal Aunt        Breast   Cancer Maternal Grandmother        Kind[place]   Diabetes Neg Hx    Heart disease Neg Hx    Stroke Neg Hx     Social History   Tobacco Use   Smoking status: Every Day    Types: Cigars   Smokeless tobacco: Never   Tobacco comments:    quit with +UPT  Substance Use Topics   Alcohol use: Yes    Alcohol/week: 1.0 standard drink of alcohol    Types: 1 Glasses of wine per week     Current Outpatient Medications:    albuterol  (VENTOLIN  HFA) 108 (90 Base) MCG/ACT inhaler,  Inhale 1-2 puffs into the lungs every 6 (six) hours as needed for up to 10 days for wheezing or shortness of breath., Disp: 1 each, Rfl: 2  No Known Allergies  I personally reviewed active problem list, medication list, allergies, family history with the patient/caregiver today.   ROS  Ten systems reviewed and is negative except as mentioned in HPI    Objective  Vitals:   01/16/23 1149  BP: 120/78  Pulse: 100  Resp: 16  SpO2: 98%  Weight: 125 lb 4.8 oz (56.8 kg)  Height: 5' 3 (1.6 m)    Body mass index is 22.2 kg/m.  Physical Exam  Constitutional: Patient appears well-developed and  well-nourished.  No distress.  HEENT: head atraumatic, normocephalic, pupils equal and reactive to light, neck supple Cardiovascular: Normal rate, regular rhythm and normal heart sounds.  No murmur heard. No BLE edema. Pulmonary/Chest: Effort normal and breath sounds normal. No respiratory distress. Skin: bruises on arms and legs only  Abdominal: Soft.  There is no tenderness. Muscular skeletal: sore all over, trigger point positive, antalgic gait but normal rom of left hip - pain was on inner thigh not groin during rom Psychiatric: Patient has a normal mood and affect. behavior is normal. Judgment and thought content normal.   Diabetic Foot Exam:     PHQ2/9:    01/16/2023   11:50 AM 12/03/2022   11:27 AM 09/27/2022   11:08 AM  Depression screen PHQ 2/9  Decreased Interest 2 0 0  Down, Depressed, Hopeless 2 0 0  PHQ - 2 Score 4 0 0  Altered sleeping 2 0 0  Tired, decreased energy 2 0 0  Change in appetite 0 0 0  Feeling bad or failure about yourself  0 0 0  Trouble concentrating 0 0 0  Moving slowly or fidgety/restless 0 0 0  Suicidal thoughts 0 0 0  PHQ-9 Score 8 0 0  Difficult doing work/chores Somewhat difficult Not difficult at all Not difficult at all    phq 9 is positive      01/16/2023   12:17 PM 01/16/2023   11:51 AM  GAD 7 : Generalized Anxiety Score  Nervous, Anxious, on Edge 3 3  Control/stop worrying 3 3  Worry too much - different things 3 3  Trouble relaxing 3 3  Restless 3 3  Easily annoyed or irritable 3 3  Afraid - awful might happen 3 3  Total GAD 7 Score 21 21  Anxiety Difficulty Extremely difficult Very difficult       Fall Risk:    01/16/2023   11:46 AM 12/03/2022   11:27 AM 09/27/2022   11:08 AM  Fall Risk   Falls in the past year? 1 0 0  Number falls in past yr: 0 0 0  Injury with Fall? 0 0 0  Risk for fall due to : Impaired balance/gait No Fall Risks   Follow up Falls prevention discussed;Education provided;Falls evaluation completed Falls  prevention discussed;Education provided;Falls evaluation completed      Assessment & Plan      Left Groin Pain Chronic, sharp pain exacerbated by walking, now constant and more intense. Possible musculoskeletal origin, but requires further investigation. -Start Celebrex  for pain and inflammation. -Consider orthopedic consultation and hip x-ray if pain persists or worsens.  Generalized Body and Joint Pain Chronic, diffuse pain involving multiple joints and muscles. Possible fibromyalgia given widespread pain and tender points on examination. -Start Duloxetine  30mg  daily for one week, then increase to  60mg  daily for pain control and potential fibromyalgia treatment.  Easy Bruising Recent onset of unexplained bruising on extremities. Previous labs showed anemia and normalized platelets. Awaiting recent lab results for further evaluation. -Review recent lab results when available, consider further workup based on results.  Anxiety Chronic, pervasive anxiety with health-related focus. Recent stressor related to sexual activity and fear of HIV despite low risk. -Start Duloxetine  for anxiety management. -Start Buspar  5mg  as needed for acute anxiety episodes. -Encourage use of condoms during sexual activity for HIV prevention.  Anemia Known anemia, type and cause unknown. Awaiting recent lab results for further evaluation. -Review recent lab results when available, consider further workup and treatment based on results.  Follow-up Plan to follow-up with primary care provider in three weeks to reassess symptoms and response to treatment.

## 2023-01-17 NOTE — Telephone Encounter (Unsigned)
 Copied from CRM [id number]. Topic: General - Other >> Jan 16, 2023  5:00 PM Teressa P wrote: Reason for CRM: pt still needs clarification on the medications that weresent in today  CB@  [phone]

## 2023-01-20 ENCOUNTER — Ambulatory Visit: Payer: Self-pay

## 2023-01-20 NOTE — Telephone Encounter (Signed)
 She already has a followup to new medication. Please advise

## 2023-01-20 NOTE — Telephone Encounter (Signed)
 Summary: lyme dx   Pt tested positive for lyme dx and was given some medication.  She wants to know if she needs to fu with provider at [facility]  She is having a problem keeping down the medication also.  CB#  [phone]       Chief Complaint: Prescribed Doxycyline from [facility] for Lyme's disease. Asking if it safe to take with her other medications. Symptoms: n/a Frequency: n/a Pertinent Negatives: Patient denies  Disposition: [] ED /[] Urgent Care (no appt availability in office) / [] Appointment(In office/virtual)/ []  [facility]/ [] Home Care/ [] Refused Recommended Disposition /[] [facility]/ [x]  Follow-up with PCP Additional Notes: Please advise pt.  Reason for Disposition  [1] Caller has URGENT medicine question about med that PCP or specialist prescribed AND [2] triager unable to answer question  Answer Assessment - Initial Assessment Questions 1. NAME of MEDICINE: What medicine(s) are you calling about?     Doxycycline 2. QUESTION: What is your question? (e.g., double dose of medicine, side effect)     Can I take this with my other medications? 3. PRESCRIBER: Who prescribed the medicine? Reason: if prescribed by specialist, call should be referred to that group.     [facility] - for Lyme's disease 4. SYMPTOMS: Do you have any symptoms? If Yes, ask: What symptoms are you having?  How bad are the symptoms (e.g., mild, moderate, severe)     N/a 5. PREGNANCY:  Is there any chance that you are pregnant? When was your last menstrual period?     No  Protocols used: Medication Question Call-A-AH

## 2023-01-20 NOTE — Telephone Encounter (Signed)
 Virtual appt sch'd with Denny Peon due to Dr Caralee Ates being booked for 1.7.25 @ 10:40

## 2023-01-20 NOTE — Telephone Encounter (Signed)
 Can have appointment and can be virtual since we already have labs

## 2023-01-21 ENCOUNTER — Telehealth (INDEPENDENT_AMBULATORY_CARE_PROVIDER_SITE_OTHER): Payer: Medicaid Other | Admitting: Physician Assistant

## 2023-01-21 DIAGNOSIS — R112 Nausea with vomiting, unspecified: Secondary | ICD-10-CM | POA: Diagnosis not present

## 2023-01-21 DIAGNOSIS — Z7189 Other specified counseling: Secondary | ICD-10-CM

## 2023-01-21 MED ORDER — ONDANSETRON 4 MG PO TBDP: 4.0000 mg | ORAL_TABLET | Freq: Three times a day (TID) | ORAL | 0 refills | Status: AC | PRN

## 2023-01-21 NOTE — Progress Notes (Signed)
 Virtual Visit via Video Note  I connected with Samantha Knight on 01/21/23 at 10:40 AM EST by a video enabled telemedicine application and verified that I am speaking with the correct person using two identifiers.   Today's Provider: [place], MHS, PA-C Introduced myself to the patient as a PA-C and provided education on APPs in clinical practice.   Location: Patient: at home  Provider: [facility], [place], [place]    I discussed the limitations of evaluation and management by telemedicine and the availability of in person appointments. The patient expressed understanding and agreed to proceed.  Chief Complaint  Patient presents with   Lab Results    Discuss dx of lyme disease- took medicines, but having hard time keeping down.     History of Present Illness:  Discussed the use of AI scribe software for clinical note transcription with the patient, who gave verbal consent to proceed.  History of Present Illness   The patient, diagnosed with Lyme disease, has been prescribed multiple medications including albuterol , Buspar , Celebrex , doxycycline, and duloxetine . She expressed concerns about potential drug interactions, particularly with the newly prescribed duloxetine , which she has not yet started. The patient has been taking doxycycline and Celebrex  as needed, but has not initiated the other medications.  The patient has been experiencing significant nausea, which she attributes to the doxycycline. She has been struggling to manage this side effect, despite efforts to eat more regularly. The patient also expressed concerns about the impact of duloxetine  on her mood.  Regarding her Lyme disease, the patient was unsure if additional medication was necessary beyond the prescribed doxycycline. She was also uncertain about the effectiveness of her current pain management regimen, which includes Celebrex .        Observations/Objective:  Due to the nature of the  virtual visit, physical exam and observations are limited. Able to obtain the following observations:   Alert, oriented, Appears comfortable, in no acute distress.  No scleral injection, no appreciated hoarseness, tachypnea, wheeze or strider. Able to maintain conversation without visible strain.  No cough appreciated during visit.   Assessment and Plan:  Problem List Items Addressed This Visit   None Visit Diagnoses       Medication care plan discussed with patient    -  Primary     Nausea and vomiting, unspecified vomiting type       Relevant Medications   ondansetron  (ZOFRAN -ODT) 4 MG disintegrating tablet      Medication Management Concern about potential drug interactions and appropriate use of current medications. Currently taking albuterol  inhaler PRN, Buspar  5 mg BID, Celebrex  100 mg BID, doxycycline 100 mg BID, and prescribed duloxetine  30 mg QD but not started. Primary concern is interaction between duloxetine  and Buspar , potentially causing serotonin syndrome, though rare. Symptoms include jitteriness, fever, and sweating. Experiencing nausea, likely due to doxycycline. - Educate on potential for serotonin syndrome with high doses of duloxetine  and Buspar , including symptoms such as jitteriness, fever, and sweating. - Advise starting duloxetine  at 30 mg QD and monitor for mood changes. - Recommend taking doxycycline with food to minimize nausea. - Prescribe Zofran  medication PRN.  Pain Management Experiencing pain and prescribed Celebrex  100 mg BID for its anti-inflammatory properties. Duloxetine , once taken for a few weeks, can also help with pain management. If concerned about mood impact, start at 30 mg and consider increasing to 60 mg if pain management is insufficient and mood is stable. - Continue Celebrex  100 mg BID as  needed for pain. - Start duloxetine  30 mg QD and consider increasing to 60 mg if pain management is insufficient and mood is stable.  Lyme  Disease Diagnosed with Lyme disease and currently on a two-week course of doxycycline 100 mg BID. This duration is typically sufficient to clear the infection. - Continue doxycycline 100 mg BID for the full two-week course.  No follow-ups on file.      Follow Up Instructions:    I discussed the assessment and treatment plan with the patient. The patient was provided an opportunity to ask questions and all were answered. The patient agreed with the plan and demonstrated an understanding of the instructions.   The patient was advised to call back or seek an in-person evaluation if the symptoms worsen or if the condition fails to improve as anticipated.  I provided 7 minutes of non-face-to-face time during this encounter.   I, Naveah Brave E Mikyla Schachter, PA-C, have reviewed all documentation for this visit. The documentation on 01/21/23 for the exam, diagnosis, procedures, and orders are all accurate and complete.   [place], MHS, PA-C [facility]

## 2023-02-07 ENCOUNTER — Ambulatory Visit: Payer: Medicaid Other | Admitting: Internal Medicine

## 2023-02-07 ENCOUNTER — Ambulatory Visit (INDEPENDENT_AMBULATORY_CARE_PROVIDER_SITE_OTHER): Payer: Medicaid Other | Admitting: Physician Assistant

## 2023-02-07 ENCOUNTER — Encounter: Payer: Self-pay | Admitting: Physician Assistant

## 2023-02-07 VITALS — BP 120/72 | HR 71 | Resp 16 | Ht 63.0 in | Wt 126.0 lb

## 2023-02-07 DIAGNOSIS — L989 Disorder of the skin and subcutaneous tissue, unspecified: Secondary | ICD-10-CM | POA: Diagnosis not present

## 2023-02-07 DIAGNOSIS — F411 Generalized anxiety disorder: Secondary | ICD-10-CM | POA: Insufficient documentation

## 2023-02-07 DIAGNOSIS — L089 Local infection of the skin and subcutaneous tissue, unspecified: Secondary | ICD-10-CM | POA: Diagnosis not present

## 2023-02-07 DIAGNOSIS — L853 Xerosis cutis: Secondary | ICD-10-CM

## 2023-02-07 DIAGNOSIS — F4310 Post-traumatic stress disorder, unspecified: Secondary | ICD-10-CM | POA: Diagnosis not present

## 2023-02-07 DIAGNOSIS — Z8619 Personal history of other infectious and parasitic diseases: Secondary | ICD-10-CM | POA: Insufficient documentation

## 2023-02-07 NOTE — Assessment & Plan Note (Signed)
Chronic, ongoing GAD7 and PHQ9 reviewed today - scores of 9 and 14 respectively  She has not started Duloxetine yet due to concerns for side effects- reviewed that she can adjust administration time to best suit her day to day schedule and child care requirements She does report benefit with Buspar regarding anxiety- continue this  Reviewed timeline for Duloxetine to reach therapeutic effects and need for follow up in 6 weeks to assess response. Reviewed she can stop medication if she is having severe side effects and we can try another agent if desired

## 2023-02-07 NOTE — Assessment & Plan Note (Signed)
  Persistent facial rash with circular, reddish lesions unresponsive to antifungal treatment. Differential includes dry skin, eczema, psoriasis, and tinea infection, the last 3 less likely though given presentation. Referral to dermatology requested by patient. Patient prefers [facility]. - Refer to [facility] for evaluation - Patient to follow up with dermatology and call if referral issues arise

## 2023-02-07 NOTE — Progress Notes (Signed)
Established Patient Office Visit  Name: Samantha Knight   MRN: [id number]    DOB: 08-01-1995   Date:02/07/2023  Today's Provider: Jacquelin Hawking, MHS, PA-C Introduced myself to the patient as a PA-C and provided education on APPs in clinical practice.         Subjective  Chief Complaint  Chief Complaint  Patient presents with   Follow-up    From Lyme disease, still feeling bodyaches    HPI  Discussed the use of AI scribe software for clinical note transcription with the patient, who gave verbal consent to proceed.  History of Present Illness   The patient, previously diagnosed with Lyme disease, presents for a follow-up visit after completing a course of doxycycline. They report persistent pain in the legs and back, although the pain is less widespread than before. The patient also reports fatigue but denies any fever. They express concern about ongoing symptoms despite treatment completion.  The patient has been prescribed celecoxib for pain management, which they take as needed. They have not yet started duloxetine (Cymbalta) due to concerns about potential side effects, particularly sedation, as they have young children to care for. They are also taking Buspar, which they report has helped them feel calmer.  In addition to these concerns, the patient reports persistent skin issues on their face. They have used a prescribed antifungal wash and cream without improvement. The patient also reports a skin issue on the left foot, characterized by persistent peeling. They have attempted home management without success.  The patient is seeking further treatment options for their ongoing symptoms and skin issues. They have expressed interest in seeing a dermatologist and are concerned about the potential impact of their symptoms on their daily life and responsibilities.              02/07/2023    9:11 AM 01/16/2023   11:50 AM 12/03/2022   11:27 AM 09/27/2022   11:08 AM   Depression screen PHQ 2/9  Decreased Interest 0 2 0 0  Down, Depressed, Hopeless 1 2 0 0  PHQ - 2 Score 1 4 0 0  Altered sleeping 3 2 0 0  Tired, decreased energy 3 2 0 0  Change in appetite 3 0 0 0  Feeling bad or failure about yourself  1 0 0 0  Trouble concentrating 3 0 0 0  Moving slowly or fidgety/restless 0 0 0 0  Suicidal thoughts 0 0 0 0  PHQ-9 Score 14 8 0 0  Difficult doing work/chores  Somewhat difficult Not difficult at all Not difficult at all      02/07/2023    9:12 AM 01/16/2023   12:17 PM 01/16/2023   11:51 AM  GAD 7 : Generalized Anxiety Score  Nervous, Anxious, on Edge 1 3 3   Control/stop worrying 1 3 3   Worry too much - different things 1 3 3   Trouble relaxing 3 3 3   Restless 0 3 3  Easily annoyed or irritable 2 3 3   Afraid - awful might happen 1 3 3   Total GAD 7 Score 9 21 21   Anxiety Difficulty  Extremely difficult Very difficult      Patient Active Problem List   Diagnosis Date Noted   GAD (generalized anxiety disorder) 02/07/2023   History of Lyme disease 02/07/2023   Pelvic pain 10/21/2017   Skin lesion 10/21/2017   BMI 30.0-30.9,adult 01/10/2017    Past Surgical History:  Procedure Laterality Date  NO PAST SURGERIES      Family History  Problem Relation Age of Onset   Asthma Son    Autism Son    Cancer Maternal Aunt        Breast   Cancer Maternal Grandmother        Kindey   Diabetes Neg Hx    Heart disease Neg Hx    Stroke Neg Hx     Social History   Tobacco Use   Smoking status: Every Day    Types: Cigars   Smokeless tobacco: Never   Tobacco comments:    quit with +UPT  Substance Use Topics   Alcohol use: Yes    Alcohol/week: 1.0 standard drink of alcohol    Types: 1 Glasses of wine per week     Current Outpatient Medications:    busPIRone (BUSPAR) 5 MG tablet, Take 1 tablet (5 mg total) by mouth 2 (two) times daily., Disp: 60 tablet, Rfl: 0   celecoxib (CELEBREX) 100 MG capsule, Take 1 capsule (100 mg total) by  mouth 2 (two) times daily., Disp: 60 capsule, Rfl: 0   ondansetron (ZOFRAN-ODT) 4 MG disintegrating tablet, Take 1 tablet (4 mg total) by mouth every 8 (eight) hours as needed for nausea or vomiting., Disp: 20 tablet, Rfl: 0   albuterol (VENTOLIN HFA) 108 (90 Base) MCG/ACT inhaler, Inhale 1-2 puffs into the lungs every 6 (six) hours as needed for up to 10 days for wheezing or shortness of breath., Disp: 1 each, Rfl: 2   DULoxetine (CYMBALTA) 30 MG capsule, Take 1-2 capsules (30-60 mg total) by mouth daily. One in am for the first week after two in am (Patient not taking: Reported on 02/07/2023), Disp: 60 capsule, Rfl: 0  No Known Allergies  I personally reviewed active problem list, medication list, allergies, notes from last encounter, lab results with the patient/caregiver today.   Review of Systems  Constitutional:  Positive for malaise/fatigue. Negative for fever and weight loss.  Musculoskeletal:  Positive for back pain and myalgias.      Objective  Vitals:   02/07/23 0909  BP: 120/72  Pulse: 71  Resp: 16  Weight: 126 lb (57.2 kg)  Height: 5\' 3"  (1.6 m)    Body mass index is 22.32 kg/m.  Physical Exam Vitals reviewed.  Constitutional:      General: She is awake.     Appearance: Normal appearance. She is well-developed and well-groomed.  HENT:     Head: Normocephalic and atraumatic.  Pulmonary:     Effort: Pulmonary effort is normal.  Musculoskeletal:     Cervical back: Normal range of motion.  Feet:     Left foot:     Skin integrity: Callus and dry skin present. No ulcer, blister, skin breakdown, erythema, warmth or fissure.     Toenail Condition: Left toenails are normal.  Skin:    Comments: She has concerns for faint circular lesions on cheeks and forehead  Areas are very faint and do not show central clearing or erythema  Neurological:     Mental Status: She is alert.  Psychiatric:        Mood and Affect: Mood normal.        Behavior: Behavior normal.  Behavior is cooperative.        Thought Content: Thought content normal.        Judgment: Judgment normal.      Recent Results (from the past 2160 hours)  Lab report - scanned  Status: None   Collection Time: 01/15/23 12:47 PM  Result Value Ref Range   EGFR 101.0     Comment: Abstracted by HIM   HM HIV Screening Negative - Validated     Comment: Abstracted by HIM     PHQ2/9:    02/07/2023    9:11 AM 01/16/2023   11:50 AM 12/03/2022   11:27 AM 09/27/2022   11:08 AM  Depression screen PHQ 2/9  Decreased Interest 0 2 0 0  Down, Depressed, Hopeless 1 2 0 0  PHQ - 2 Score 1 4 0 0  Altered sleeping 3 2 0 0  Tired, decreased energy 3 2 0 0  Change in appetite 3 0 0 0  Feeling bad or failure about yourself  1 0 0 0  Trouble concentrating 3 0 0 0  Moving slowly or fidgety/restless 0 0 0 0  Suicidal thoughts 0 0 0 0  PHQ-9 Score 14 8 0 0  Difficult doing work/chores  Somewhat difficult Not difficult at all Not difficult at all      Fall Risk:    01/16/2023   11:46 AM 12/03/2022   11:27 AM 09/27/2022   11:08 AM  Fall Risk   Falls in the past year? 1 0 0  Number falls in past yr: 0 0 0  Injury with Fall? 0 0 0  Risk for fall due to : Impaired balance/gait No Fall Risks   Follow up Falls prevention discussed;Education provided;Falls evaluation completed Falls prevention discussed;Education provided;Falls evaluation completed       Functional Status Survey:      Assessment & Plan  Problem List Items Addressed This Visit       Musculoskeletal and Integument   Skin lesion    Persistent facial rash with circular, reddish lesions unresponsive to antifungal treatment. Differential includes dry skin, eczema, psoriasis, and tinea infection, the last 3 less likely though given presentation. Referral to dermatology requested by patient. Patient prefers [facility]. - Refer to [facility] for evaluation - Patient to follow up with dermatology and call  if referral issues arise      Relevant Orders   Ambulatory referral to Dermatology     Other   GAD (generalized anxiety disorder) - Primary   Chronic, ongoing GAD7 and PHQ9 reviewed today - scores of 9 and 14 respectively  She has not started Duloxetine yet due to concerns for side effects- reviewed that she can adjust administration time to best suit her day to day schedule and child care requirements She does report benefit with Buspar regarding anxiety- continue this  Reviewed timeline for Duloxetine to reach therapeutic effects and need for follow up in 6 weeks to assess response. Reviewed she can stop medication if she is having severe side effects and we can try another agent if desired        History of Lyme disease   Other Visit Diagnoses       Dry skin          Assessment and Plan    Post-Treatment Lyme Disease Syndrome Persistent fatigue and muscle aches post-doxycycline treatment for Lyme disease, localized to legs and back. Significant improvement from initial full-body aches. No fever or weight loss. Discussed prolonged symptoms of post-treatment Lyme disease syndrome. Patient concerned about pain management and duloxetine side effects. Explained duloxetine benefits for muscle aches and mood stabilization, with full effects in four weeks. Advised to discontinue if severe side effects occur. - Continue celecoxib as needed for  pain - Start duloxetine, take at bedtime initially to monitor for drowsiness - Follow up in six weeks to assess efficacy and side effects   Foot Peeling Chronic peeling of the left foot, likely manageable with home care. Advised on regimen including soaking feet, using a pumice stone, and applying thick ointment. - Soak feet in warm water with Epsom salt for 15-20 minutes - Use a pumice stone to gently exfoliate - Apply thick ointment like Aquaphor or Vaseline, especially at night  Follow-up - Follow up in six weeks after starting  duloxetine - Patient to call dermatology office if no appointment is scheduled within a week - Contact primary care office if referral issues arise.         Return in about 6 weeks (around 03/21/2023) for anxiety, Depression.   I, Tytionna Cloyd E Chay Mazzoni, PA-C, have reviewed all documentation for this visit. The documentation on 02/07/23 for the exam, diagnosis, procedures, and orders are all accurate and complete.   Jacquelin Hawking, MHS, PA-C [facility]

## 2023-02-07 NOTE — Patient Instructions (Signed)
VISIT SUMMARY:  During today's visit, we discussed your ongoing symptoms following Lyme disease treatment, including persistent leg and back pain, fatigue, and skin issues on your face and foot. We reviewed your current medications and concerns about potential side effects. We also discussed further treatment options and referrals to specialists.  YOUR PLAN:  -POST-TREATMENT LYME DISEASE SYNDROME: Post-Treatment Lyme Disease Syndrome refers to persistent symptoms such as fatigue and muscle aches that continue even after completing treatment for Lyme disease. We recommend continuing celecoxib as needed for pain and starting duloxetine at bedtime to help with muscle aches and mood stabilization. Please monitor for any severe side effects and discontinue if necessary. We will follow up in six weeks to assess the efficacy and any side effects of the new medication.  -FACIAL RASH: The persistent facial rash with circular, reddish lesions may be due to conditions like eczema, psoriasis, or a tinea infection. We are referring you to [facility] for a thorough evaluation. Please follow up with dermatology and call us if you encounter any issues with the referral.  -FOOT PEELING: The chronic peeling of your left foot can often be managed with home care. We recommend soaking your feet in warm water with Epsom salt for 15-20 minutes, using a pumice stone to gently exfoliate, and applying a thick ointment like Aquaphor or Vaseline, especially at night.  INSTRUCTIONS:  Please follow up in six weeks after starting duloxetine. If you do not have an appointment with dermatology within a week, call their office. Contact our primary care office if you encounter any issues with the referral.

## 2023-02-10 ENCOUNTER — Ambulatory Visit: Payer: Self-pay | Admitting: Obstetrics and Gynecology

## 2023-02-11 ENCOUNTER — Other Ambulatory Visit: Payer: Self-pay | Admitting: Family Medicine

## 2023-02-11 DIAGNOSIS — F411 Generalized anxiety disorder: Secondary | ICD-10-CM

## 2023-02-11 DIAGNOSIS — R1032 Left lower quadrant pain: Secondary | ICD-10-CM

## 2023-02-11 DIAGNOSIS — M791 Myalgia, unspecified site: Secondary | ICD-10-CM

## 2023-02-12 ENCOUNTER — Encounter: Payer: Self-pay | Admitting: Family Medicine

## 2023-02-14 ENCOUNTER — Ambulatory Visit: Payer: Medicaid Other | Admitting: Internal Medicine

## 2023-02-14 ENCOUNTER — Encounter: Payer: Self-pay | Admitting: Internal Medicine

## 2023-02-14 ENCOUNTER — Ambulatory Visit (INDEPENDENT_AMBULATORY_CARE_PROVIDER_SITE_OTHER): Payer: Medicaid Other | Admitting: Internal Medicine

## 2023-02-14 ENCOUNTER — Other Ambulatory Visit: Payer: Self-pay

## 2023-02-14 VITALS — BP 110/70 | HR 96 | Temp 98.4°F | Resp 18 | Ht 63.0 in | Wt 124.3 lb

## 2023-02-14 DIAGNOSIS — M2550 Pain in unspecified joint: Secondary | ICD-10-CM | POA: Diagnosis not present

## 2023-02-14 DIAGNOSIS — F411 Generalized anxiety disorder: Secondary | ICD-10-CM

## 2023-02-14 DIAGNOSIS — J340 Abscess, furuncle and carbuncle of nose: Secondary | ICD-10-CM

## 2023-02-14 DIAGNOSIS — F4310 Post-traumatic stress disorder, unspecified: Secondary | ICD-10-CM | POA: Diagnosis not present

## 2023-02-14 MED ORDER — BUSPIRONE HCL 5 MG PO TABS: 5.0000 mg | ORAL_TABLET | Freq: Two times a day (BID) | ORAL | 1 refills | Status: AC

## 2023-02-14 MED ORDER — MUPIROCIN 2 % EX OINT: 1.0000 | TOPICAL_OINTMENT | Freq: Two times a day (BID) | CUTANEOUS | 0 refills | Status: AC

## 2023-02-14 MED ORDER — CELECOXIB 100 MG PO CAPS: 100.0000 mg | ORAL_CAPSULE | Freq: Two times a day (BID) | ORAL | 0 refills | Status: DC

## 2023-02-14 NOTE — Progress Notes (Signed)
Acute Office Visit  Subjective:     Patient ID: Samantha Knight, female    DOB: 1995-08-29, 29 y.o.   MRN: [id number]  Chief Complaint  Patient presents with   Follow-up    Seen at [facility] dx with staph infection nose. Still have nose pain even after taking antibiotics for 5 days    HPI Patient is in today for Urgent Care follow up. She was told she had a nasal staph infection, on Bactrim x 10 days and Mupirocin ointment which she has been compliant with but still having pain. Been on medication now for 5 days. No fevers, sinus pain.   Has been having ongoing joint pains. Was told she had Lyme's disease but has a history of tick bite back when she was 28 years old, not currently. Lyme IgG positive but IgM negative, treated with Doxycycline and still having ongoing joint pains. Autoimmune work up negative as well. Treated with celebrex 100 mg BID which does help and Cymbalta 30 which she has not started yet.   Review of Systems  Constitutional:  Negative for chills and fever.  HENT:  Negative for congestion, ear pain, sinus pain and sore throat.   Musculoskeletal:  Positive for joint pain.        Objective:    BP 110/70 (Cuff Size: Normal)   Pulse 96   Temp 98.4 F (36.9 C) (Oral)   Resp 18   Ht 5\' 3"  (1.6 m)   Wt 124 lb 4.8 oz (56.4 kg)   SpO2 98%   BMI 22.02 kg/m  BP Readings from Last 3 Encounters:  02/14/23 110/70  02/07/23 120/72  01/16/23 120/78   Wt Readings from Last 3 Encounters:  02/14/23 124 lb 4.8 oz (56.4 kg)  02/07/23 126 lb (57.2 kg)  01/16/23 125 lb 4.8 oz (56.8 kg)      Physical Exam Constitutional:      Appearance: Normal appearance.  HENT:     Head: Normocephalic and atraumatic.     Nose:     Comments: Abscess on the right facial side of nostril, difficult to visualize     Mouth/Throat:     Mouth: Mucous membranes are moist.     Comments: PND Eyes:     Conjunctiva/sclera: Conjunctivae normal.  Cardiovascular:     Rate and  Rhythm: Normal rate and regular rhythm.  Pulmonary:     Effort: Pulmonary effort is normal.     Breath sounds: Normal breath sounds.  Skin:    General: Skin is warm and dry.  Neurological:     General: No focal deficit present.     Mental Status: She is alert. Mental status is at baseline.  Psychiatric:        Mood and Affect: Mood normal.        Behavior: Behavior normal.     No results found for any visits on 02/14/23.      Assessment & Plan:   1. Nasal abscess (Primary): Continue Bactrim and Mupirocin, referral placed to ENT for complete nasal exam. Discussed if she develops sinus/facial pain or fevers to present to the ER.   - Ambulatory referral to ENT - mupirocin ointment (BACTROBAN) 2 %; Apply 1 Application topically 2 (two) times daily.  Dispense: 22 g; Refill: 0  2. Polyarthralgia: Lyme test not consistent with acute Lyme, discussed we do not treat with chronic Lyme disease. Autoimmune work up negative, can continue Celebrex.   - celecoxib (CELEBREX) 100 MG capsule;  Take 1 capsule (100 mg total) by mouth 2 (two) times daily.  Dispense: 120 capsule; Refill: 0  3. GAD (generalized anxiety disorder): Plan to start Cymbalta and Buspar which I will refill, follow up in 6 weeks.   - busPIRone (BUSPAR) 5 MG tablet; Take 1 tablet (5 mg total) by mouth 2 (two) times daily.  Dispense: 180 tablet; Refill: 1  Return in about 6 weeks (around 03/28/2023).  Margarita Mail, DO

## 2023-02-19 DIAGNOSIS — F4310 Post-traumatic stress disorder, unspecified: Secondary | ICD-10-CM | POA: Diagnosis not present

## 2023-02-20 ENCOUNTER — Ambulatory Visit: Payer: Self-pay

## 2023-02-20 DIAGNOSIS — R059 Cough, unspecified: Secondary | ICD-10-CM | POA: Diagnosis not present

## 2023-02-20 DIAGNOSIS — J101 Influenza due to other identified influenza virus with other respiratory manifestations: Secondary | ICD-10-CM | POA: Diagnosis not present

## 2023-02-20 DIAGNOSIS — Z20822 Contact with and (suspected) exposure to covid-19: Secondary | ICD-10-CM | POA: Diagnosis not present

## 2023-02-20 DIAGNOSIS — Z3202 Encounter for pregnancy test, result negative: Secondary | ICD-10-CM | POA: Diagnosis not present

## 2023-02-20 NOTE — Telephone Encounter (Signed)
  Chief Complaint: Vomiting, nausea, abdominal pain, diarrhea and likely fever. Pt thinks she has the flu Symptoms: above Frequency: today Pertinent Negatives: Patient denies  Disposition: [] ED /[x] Urgent Care (no appt availability in office) / [] Appointment(In office/virtual)/ []  [facility]/ [] Home Care/ [] Refused Recommended Disposition /[]  Mobile Bus/ []  Follow-up with PCP Additional Notes: Pt states that she started today feeling fine and by this afternoon feels terrible. S/s came on suddenly.  She thinks she has the flu. Pt will go to UC for flu testing and care this evening.   Reason for Disposition  [1] SEVERE vomiting (e.g., 6 or more times/day, vomits everything) BUT [2] hydrated  Answer Assessment - Initial Assessment Questions 1. VOMITING SEVERITY: How many times have you vomited in the past 24 hours?     - MILD:  1 - 2 times/day    - MODERATE: 3 - 5 times/day, decreased oral intake without significant weight loss or symptoms of dehydration    - SEVERE: 6 or more times/day, vomits everything or nearly everything, with significant weight loss, symptoms of dehydration      4 times 2. ONSET: When did the vomiting begin?      Today - around 3 pm 3. FLUIDS: What fluids or food have you vomited up today? Have you been able to keep any fluids down?     no 4. ABDOMEN PAIN: Are your having any abdomen pain? If Yes : How bad is it and what does it feel like? (e.g., crampy, dull, intermittent, constant)      Abdominal - middle lower 5. DIARRHEA: Is there any diarrhea? If Yes, ask: How many times today?      Yes - 2 bouts 6. CONTACTS: Is there anyone else in the family with the same symptoms?      no 7. CAUSE: What do you think is causing your vomiting?     Flu? 9. OTHER SYMPTOMS: Do you have any other symptoms? (e.g., fever, headache, vertigo, vomiting blood or coffee grounds, recent head injury)     Chills/ sweating  Protocols used:  Vomiting-A-AH

## 2023-02-21 NOTE — Telephone Encounter (Signed)
 Can try virtual appointment if she cannot get in

## 2023-02-21 NOTE — Telephone Encounter (Signed)
 Lvm asking pt to return call to schedule appt and informing her that it could be virtual if she is not able to make it into the office.

## 2023-02-27 DIAGNOSIS — F4310 Post-traumatic stress disorder, unspecified: Secondary | ICD-10-CM | POA: Diagnosis not present

## 2023-02-27 DIAGNOSIS — J3489 Other specified disorders of nose and nasal sinuses: Secondary | ICD-10-CM | POA: Diagnosis not present

## 2023-03-05 DIAGNOSIS — F4310 Post-traumatic stress disorder, unspecified: Secondary | ICD-10-CM | POA: Diagnosis not present

## 2023-03-10 ENCOUNTER — Ambulatory Visit: Payer: Medicaid Other | Admitting: Dermatology

## 2023-03-12 DIAGNOSIS — F4310 Post-traumatic stress disorder, unspecified: Secondary | ICD-10-CM | POA: Diagnosis not present

## 2023-03-19 DIAGNOSIS — F4310 Post-traumatic stress disorder, unspecified: Secondary | ICD-10-CM | POA: Diagnosis not present

## 2023-03-26 DIAGNOSIS — F4310 Post-traumatic stress disorder, unspecified: Secondary | ICD-10-CM | POA: Diagnosis not present

## 2023-03-28 ENCOUNTER — Ambulatory Visit: Payer: Medicaid Other | Admitting: Internal Medicine

## 2023-04-28 ENCOUNTER — Ambulatory Visit: Payer: Medicaid Other | Admitting: Dermatology

## 2023-06-06 ENCOUNTER — Other Ambulatory Visit: Payer: Self-pay | Admitting: Internal Medicine

## 2023-06-06 ENCOUNTER — Ambulatory Visit: Payer: Self-pay | Admitting: *Deleted

## 2023-06-06 DIAGNOSIS — N898 Other specified noninflammatory disorders of vagina: Secondary | ICD-10-CM

## 2023-06-06 MED ORDER — FLUCONAZOLE 150 MG PO TABS: 150.0000 mg | ORAL_TABLET | Freq: Once | ORAL | 0 refills | Status: AC

## 2023-06-06 NOTE — Telephone Encounter (Signed)
 Pt.notified

## 2023-06-06 NOTE — Telephone Encounter (Signed)
  Chief Complaint: requesting medication for vaginal itching after started antibiotics from dentist  Symptoms: vaginal itching started after starting antibiotics from dentist. "irritated" has used monistat with little relief but sx not gone Frequency: 2 days  Pertinent Negatives: Patient denies fever no vaginal discharge, no odor no pain with urination Disposition: [] ED /[] Urgent Care (no appt availability in office) / [] Appointment(In office/virtual)/ []  [facility]/ [] Home Care/ [] Refused Recommended Disposition /[] [place] [facility]/ [x]  Follow-up with PCP Additional Notes:   No available appt . Today . Requesting medication due to holiday weekend. Please advise via My chart messages or call back. Recommended to continue monistat as directed and see if sx resolve. Also recommended probiotic yogurt . Please advise.      Reason for Disposition  [1] Symptoms of a yeast infection (i.e., itchy, white discharge, not bad smelling) AND [2] not improved > 3 days following Care Advice    Not improved x 2 days  Answer Assessment - Initial Assessment Questions 1. SYMPTOM: "What's the main symptom you're concerned about?" (e.g., pain, itching, dryness)     Itching  2. LOCATION: "Where is the  sx located?" (e.g., inside/outside, left/right)     Vaginal area 3. ONSET: "When did the  sx  start?"     2 days ago after starting antibiotics 4. PAIN: "Is there any pain?" If Yes, ask: "How bad is it?" (Scale: 1-10; mild, moderate, severe)   -  MILD (1-3): Doesn't interfere with normal activities.    -  MODERATE (4-7): Interferes with normal activities (e.g., work or school) or awakens from sleep.     -  SEVERE (8-10): Excruciating pain, unable to do any normal activities.     Na  5. ITCHING: "Is there any itching?" If Yes, ask: "How bad is it?" (Scale: 1-10; mild, moderate, severe)     Yes not severe yet  6. CAUSE: "What do you think is causing the discharge?" "Have you had the same  problem before? What happened then?"     No discharge 7. OTHER SYMPTOMS: "Do you have any other symptoms?" (e.g., fever, itching, vaginal bleeding, pain with urination, injury to genital area, vaginal foreign body)     Vaginal itching 8. PREGNANCY: "Is there any chance you are pregnant?" "When was your last menstrual period?"     na  Protocols used: Vaginal Symptoms-A-AH

## 2023-07-15 ENCOUNTER — Ambulatory Visit: Admitting: Dermatology

## 2023-09-29 ENCOUNTER — Ambulatory Visit: Payer: Medicaid Other | Admitting: Internal Medicine

## 2023-11-11 ENCOUNTER — Ambulatory Visit: Admitting: Internal Medicine

## 2023-11-11 ENCOUNTER — Other Ambulatory Visit: Payer: Self-pay

## 2023-11-11 ENCOUNTER — Encounter: Payer: Self-pay | Admitting: Internal Medicine

## 2023-11-11 VITALS — BP 110/70 | HR 97 | Temp 98.7°F | Resp 16 | Ht 63.0 in | Wt 132.5 lb

## 2023-11-11 DIAGNOSIS — Z3009 Encounter for other general counseling and advice on contraception: Secondary | ICD-10-CM

## 2023-11-11 DIAGNOSIS — Z23 Encounter for immunization: Secondary | ICD-10-CM | POA: Diagnosis not present

## 2023-11-11 DIAGNOSIS — Z0000 Encounter for general adult medical examination without abnormal findings: Secondary | ICD-10-CM | POA: Diagnosis not present

## 2023-11-11 DIAGNOSIS — Z720 Tobacco use: Secondary | ICD-10-CM | POA: Diagnosis not present

## 2023-11-11 LAB — CBC WITH DIFFERENTIAL/PLATELET
Absolute Lymphocytes: 1695 {cells}/uL (ref 850–3900)
Absolute Monocytes: 390 {cells}/uL (ref 200–950)
Basophils Absolute: 30 {cells}/uL (ref 0–200)
Basophils Relative: 0.6 %
Eosinophils Absolute: 100 {cells}/uL (ref 15–500)
Eosinophils Relative: 2 %
HCT: 37.7 % (ref 35.0–45.0)
Hemoglobin: 12.1 g/dL (ref 11.7–15.5)
MCH: 30.4 pg (ref 27.0–33.0)
MCHC: 32.1 g/dL (ref 32.0–36.0)
MCV: 94.7 fL (ref 80.0–100.0)
MPV: 10.9 fL (ref 7.5–12.5)
Monocytes Relative: 7.8 %
Neutro Abs: 2785 {cells}/uL (ref 1500–7800)
Neutrophils Relative %: 55.7 %
Platelets: 188 Thousand/uL (ref 140–400)
RBC: 3.98 Million/uL (ref 3.80–5.10)
RDW: 13.5 % (ref 11.0–15.0)
Total Lymphocyte: 33.9 %
WBC: 5 Thousand/uL (ref 3.8–10.8)

## 2023-11-11 NOTE — Patient Instructions (Signed)

## 2023-11-11 NOTE — Progress Notes (Signed)
 Name: Samantha Knight   MRN: [id number]    DOB: 09/14/1995   Date:11/11/2023       Progress Note  Subjective  Chief Complaint  Chief Complaint  Patient presents with   Annual Exam    HPI  Patient presents for annual CPE.  Discussed the use of AI scribe software for clinical note transcription with the patient, who gave verbal consent to proceed.  History of Present Illness Samantha Knight is a 28 year old female who presents for a routine physical exam and discussion of cervical cancer screening and contraception options.  Last year, she had a Pap smear showing atypical squamous cells of undetermined significance (ASCUS) but was negative for HPV. She is considering contraception options, having previously tried the contraceptive patch but discontinued it due to adverse effects. She is currently not using any contraception and is interested in birth control pills and IUDs. Her smoking status is a consideration for estrogen-containing pills. Her menstrual periods are regular without significant issues. She is unsure about her HPV vaccination status and is interested in receiving the vaccine if not previously vaccinated. She has a family history of breast cancer, with her aunt having passed away from the disease at an older age.   Diet: Regular Exercise: 2 days 120 minutes  Last Eye Exam: completed Last Dental Exam: completed  Flowsheet Row Office Visit from 11/11/2023 in [facility]  AUDIT-C Score 1   Depression: Phq 9 is  negative    11/11/2023    8:29 AM 02/07/2023    9:11 AM 01/16/2023   11:50 AM 12/03/2022   11:27 AM 09/27/2022   11:08 AM  Depression screen PHQ 2/9  Decreased Interest 0 0 2 0 0  Down, Depressed, Hopeless 0 1 2 0 0  PHQ - 2 Score 0 1 4 0 0  Altered sleeping  3 2 0 0  Tired, decreased energy  3 2 0 0  Change in appetite  3 0 0 0  Feeling bad or failure about yourself   1 0 0 0  Trouble concentrating  3 0 0 0  Moving slowly or  fidgety/restless  0 0 0 0  Suicidal thoughts  0 0 0 0  PHQ-9 Score  14 8 0 0  Difficult doing work/chores   Somewhat difficult Not difficult at all Not difficult at all   Hypertension: BP Readings from Last 3 Encounters:  11/11/23 110/70  02/14/23 110/70  02/07/23 120/72   Obesity: Wt Readings from Last 3 Encounters:  11/11/23 132 lb 8 oz (60.1 kg)  02/14/23 124 lb 4.8 oz (56.4 kg)  02/07/23 126 lb (57.2 kg)   BMI Readings from Last 3 Encounters:  11/11/23 23.47 kg/m  02/14/23 22.02 kg/m  02/07/23 22.32 kg/m     Vaccines: HPV reviewed with the patient. Patient had first HPV vaccine at age 66, due for second and final vaccine today.  Hep C Screening: completed STD testing and prevention (HIV/chl/gon/syphilis): no concerns Intimate partner violence: negative screen  Sexual History :active Menstrual History/LMP/Abnormal Bleeding: 10/22/2023 LMP, interested in discussing birth control options for contraception  Discussed importance of follow up if any post-menopausal bleeding: not applicable  Incontinence Symptoms: negative for symptoms   Breast cancer:  - Last Mammogram: NA  Osteoporosis Prevention : Discussed high calcium and vitamin D supplementation, weight bearing exercises Bone density :not applicable   Cervical cancer screening: up-to-date, last Pap 2/24 showing ASCUS but HPV negative   Skin cancer: Discussed  monitoring for atypical lesions  Colorectal cancer: NA   Lung cancer:  Low Dose CT Chest recommended if Age 27-80 years, 20 pack-year currently smoking OR have quit w/in 15years. Patient does not qualify for screen   ECG: 07/25/2021  Advanced Care Planning: A voluntary discussion about advance care planning including the explanation and discussion of advance directives.  Discussed health care proxy and Living will, and the patient was able to identify a health care proxy .  Patient does not have a living will and power of attorney of health care   Patient  Active Problem List   Diagnosis Date Noted   GAD (generalized anxiety disorder) 02/07/2023   History of Lyme disease 02/07/2023   Pelvic pain 10/21/2017   Skin lesion 10/21/2017   BMI 30.0-30.9,adult 01/10/2017    Past Surgical History:  Procedure Laterality Date   NO PAST SURGERIES      Family History  Problem Relation Age of Onset   Asthma Son    Autism Son    Cancer Maternal Aunt        Breast   Cancer Maternal Grandmother        Kindey   Diabetes Neg Hx    Heart disease Neg Hx    Stroke Neg Hx     Social History   Socioeconomic History   Marital status: Single    Spouse name: Corporate Investment Banker   Number of children: 1   Years of education: Not on file   Highest education level: 12th grade  Occupational History   Not on file  Tobacco Use   Smoking status: Every Day    Types: Cigars   Smokeless tobacco: Never   Tobacco comments:    quit with +UPT  Vaping Use   Vaping status: Never Used  Substance and Sexual Activity   Alcohol use: Yes    Alcohol/week: 1.0 standard drink of alcohol    Types: 1 Glasses of wine per week   Drug use: Not Currently    Types: Marijuana   Sexual activity: Not Currently    Birth control/protection: None  Other Topics Concern   Not on file  Social History Narrative   Not on file   Social Drivers of Health   Financial Resource Strain: Low Risk  (11/11/2023)   Overall Financial Resource Strain (CARDIA)    Difficulty of Paying Living Expenses: Not very hard  Food Insecurity: No Food Insecurity (11/11/2023)   Hunger Vital Sign    Worried About Running Out of Food in the Last Year: Never true    Ran Out of Food in the Last Year: Never true  Transportation Needs: No Transportation Needs (11/11/2023)   PRAPARE - Administrator, Civil Service (Medical): No    Lack of Transportation (Non-Medical): No  Physical Activity: Sufficiently Active (11/11/2023)   Exercise Vital Sign    Days of Exercise per Week: 2 days    Minutes  of Exercise per Session: 120 min  Stress: No Stress Concern Present (11/11/2023)   [employer] of Occupational Health - Occupational Stress Questionnaire    Feeling of Stress: Not at all  Social Connections: Socially Isolated (11/11/2023)   Social Connection and Isolation Panel    Frequency of Communication with Friends and Family: More than three times a week    Frequency of Social Gatherings with Friends and Family: More than three times a week    Attends Religious Services: Never    Database Administrator or Organizations: No  Attends Banker Meetings: Never    Marital Status: Separated  Intimate Partner Violence: Not At Risk (11/11/2023)   Humiliation, Afraid, Rape, and Kick questionnaire    Fear of Current or Ex-Partner: No    Emotionally Abused: No    Physically Abused: No    Sexually Abused: No     Current Outpatient Medications:    albuterol  (VENTOLIN  HFA) 108 (90 Base) MCG/ACT inhaler, Inhale 1-2 puffs into the lungs every 6 (six) hours as needed for up to 10 days for wheezing or shortness of breath., Disp: 1 each, Rfl: 2   busPIRone  (BUSPAR ) 5 MG tablet, Take 1 tablet (5 mg total) by mouth 2 (two) times daily., Disp: 180 tablet, Rfl: 1   celecoxib  (CELEBREX ) 100 MG capsule, Take 1 capsule (100 mg total) by mouth 2 (two) times daily., Disp: 120 capsule, Rfl: 0   mupirocin  ointment (BACTROBAN ) 2 %, Apply 1 Application topically 2 (two) times daily., Disp: 22 g, Rfl: 0   ondansetron  (ZOFRAN -ODT) 4 MG disintegrating tablet, Take 1 tablet (4 mg total) by mouth every 8 (eight) hours as needed for nausea or vomiting., Disp: 20 tablet, Rfl: 0   DULoxetine  (CYMBALTA ) 30 MG capsule, Take 1-2 capsules (30-60 mg total) by mouth daily. One in am for the first week after two in am (Patient not taking: Reported on 02/14/2023), Disp: 60 capsule, Rfl: 0   sulfamethoxazole-trimethoprim (BACTRIM DS) 800-160 MG tablet, Take 1 tablet by mouth 2 (two) times daily. (Patient not  taking: Reported on 11/11/2023), Disp: , Rfl:   No Known Allergies   Review of Systems  All other systems reviewed and are negative.   Objective  Vitals:   11/11/23 0830  BP: 110/70  Pulse: 97  Resp: 16  Temp: 98.7 F (37.1 C)  TempSrc: Oral  SpO2: 97%  Weight: 132 lb 8 oz (60.1 kg)  Height: 5' 3 (1.6 m)    Body mass index is 23.47 kg/m.  Physical Exam Constitutional:      Appearance: Normal appearance.  HENT:     Head: Normocephalic and atraumatic.     Mouth/Throat:     Mouth: Mucous membranes are moist.     Pharynx: Oropharynx is clear.  Eyes:     Extraocular Movements: Extraocular movements intact.     Conjunctiva/sclera: Conjunctivae normal.     Pupils: Pupils are equal, round, and reactive to light.  Neck:     Comments: No thyromegaly Cardiovascular:     Rate and Rhythm: Normal rate and regular rhythm.  Pulmonary:     Effort: Pulmonary effort is normal.     Breath sounds: Normal breath sounds.  Musculoskeletal:     Cervical back: No tenderness.     Right lower leg: No edema.     Left lower leg: No edema.  Lymphadenopathy:     Cervical: No cervical adenopathy.  Skin:    General: Skin is warm and dry.  Neurological:     General: No focal deficit present.     Mental Status: She is alert. Mental status is at baseline.  Psychiatric:        Mood and Affect: Mood normal.        Behavior: Behavior normal.     Last CBC Lab Results  Component Value Date   WBC 4.6 09/27/2022   HGB 10.9 (L) 09/27/2022   HCT 33.5 (L) 09/27/2022   MCV 94.9 09/27/2022   MCH 30.9 09/27/2022   RDW 12.9 09/27/2022   PLT 179 09/27/2022  Last metabolic panel Lab Results  Component Value Date   GLUCOSE 72 09/27/2022   NA 139 09/27/2022   K 4.2 09/27/2022   CL 107 09/27/2022   CO2 25 09/27/2022   BUN 7 09/27/2022   CREATININE 0.73 09/27/2022   EGFR 101.0 01/15/2023   CALCIUM 8.9 09/27/2022   PROT 6.7 09/27/2022   ALBUMIN 4.1 05/15/2021   BILITOT 0.3 09/27/2022    ALKPHOS 34 (L) 05/15/2021   AST 10 09/27/2022   ALT 7 09/27/2022   ANIONGAP 8 07/24/2021   Last lipids Lab Results  Component Value Date   CHOL 115 09/27/2022   HDL 50 09/27/2022   LDLCALC 52 09/27/2022   TRIG 47 09/27/2022   CHOLHDL 2.3 09/27/2022   Last hemoglobin A1c Lab Results  Component Value Date   HGBA1C 4.9 11/17/2018   Last thyroid functions Lab Results  Component Value Date   TSH 0.63 09/27/2022   Last vitamin D No results found for: 25OHVITD2, 25OHVITD3, VD25OH Last vitamin B12 and Folate No results found for: VITAMINB12, FOLATE    Assessment & Plan  Assessment & Plan Adult Wellness Visit Routine wellness visit with normal vitals. Annual labs due. - Perform annual labs: kidney, liver, electrolytes, anemia, cholesterol screening.  Atypical squamous cells of undetermined significance (ASCUS) on prior Pap smear, HPV negative ASCUS with negative HPV indicates lower cervical cancer risk. Current guidelines recommend Pap smear in three years. - Repeat Pap smear in three years unless earlier screening desired.  HPV vaccination counseling and administration Discussed HPV vaccination for cervical and other HPV-related cancer prevention. Vaccination history unclear, records being checked. Gardasil vaccine is a three-dose series, effective up to age 63. - Check vaccination records for HPV vaccine history. - Administer first dose of Gardasil if no prior vaccination record. - Schedule follow-up for second dose in 1-2 months, third dose in six months.  Tobacco use Smoker with increased blood clot risk with combined estrogen birth control pills, especially over age 76.  Contraceptive counseling Discussed contraceptive options: combined hormonal pills, progesterone-only pills, IUDs, Nexplanon, Depo-Provera. Explained risks and benefits, including increased blood clot risk with estrogen pills, adherence with progesterone-only pills, long-term efficacy of  IUDs. Smoking increases blood clot risk with estrogen pills, IUDs do not carry this risk. - Provide information on contraceptive options including birth control pills and IUDs. - Advise discussion of IUD insertion with gynecologist. - Schedule follow-up in one month to recheck blood pressure if starting birth control pills.  - CBC w/Diff/Platelet - Comprehensive Metabolic Panel (CMET) - Lipid Profile - HPV 9-valent vaccine,Recombinat   -USPSTF grade A and B recommendations reviewed with patient; age-appropriate recommendations, preventive care, screening tests, etc discussed and encouraged; healthy living encouraged; see AVS for patient education given to patient -Discussed importance of 150 minutes of physical activity weekly, eat two servings of fish weekly, eat one serving of tree nuts ( cashews, pistachios, pecans, almonds.SABRA) every other day, eat 6 servings of fruit/vegetables daily and drink plenty of water and avoid sweet beverages.   -Reviewed Health Maintenance: Yes.

## 2023-11-18 ENCOUNTER — Ambulatory Visit: Payer: Self-pay | Admitting: Internal Medicine

## 2024-02-10 ENCOUNTER — Encounter: Payer: Self-pay | Admitting: Internal Medicine

## 2024-02-13 ENCOUNTER — Encounter: Payer: Self-pay | Admitting: Internal Medicine

## 2024-02-13 ENCOUNTER — Other Ambulatory Visit: Payer: Self-pay

## 2024-02-13 ENCOUNTER — Ambulatory Visit (INDEPENDENT_AMBULATORY_CARE_PROVIDER_SITE_OTHER): Admitting: Internal Medicine

## 2024-02-13 VITALS — BP 110/72 | HR 79 | Temp 98.8°F | Resp 16 | Ht 63.0 in | Wt 137.5 lb

## 2024-02-13 DIAGNOSIS — R0789 Other chest pain: Secondary | ICD-10-CM

## 2024-02-13 DIAGNOSIS — M25511 Pain in right shoulder: Secondary | ICD-10-CM

## 2024-02-13 MED ORDER — TIZANIDINE HCL 4 MG PO TABS: 4.0000 mg | ORAL_TABLET | Freq: Every evening | ORAL | 0 refills | Status: AC | PRN

## 2024-02-13 MED ORDER — CELECOXIB 100 MG PO CAPS: 100.0000 mg | ORAL_CAPSULE | Freq: Two times a day (BID) | ORAL | 1 refills | Status: AC

## 2024-02-13 NOTE — Progress Notes (Signed)
 "  Acute Office Visit  Subjective:     Patient ID: Samantha Knight, female    DOB: 09/21/1995, 29 y.o.   MRN: [id number]  Chief Complaint  Patient presents with   Arm Pain    Right arm pain for 1 week   Chest Pain    Right side  behind breast     Arm Pain  Pertinent negatives include no chest pain.  Chest Pain    Patient is in today for right chest wall and shoulder pain.   Discussed the use of AI scribe software for clinical note transcription with the patient, who gave verbal consent to proceed.  History of Present Illness Samantha Knight is a 29 year old female who presents with right-sided chest wall pain.  She describes new sharp right chest wall pain behind the breast, possibly under the muscle, for about 1 week. Pain is worse with bending, reaching, lifting her arm, and is reproducible with palpation. It began in one area and improved with pain medication, then shifted to a nearby area.  She recently fell directly onto her shoulder and has had persistent shoulder and armpit pain with popping sensations, tenderness, and difficulty with certain arm movements. She now also notes some elbow pain and wonders if it is related.  She has been taking Celebrex  100 mg twice daily for a couple of days with slight relief and asks if it is safe and if she can take it with Tylenol .   Review of Systems  Cardiovascular:  Negative for chest pain.  Musculoskeletal:  Positive for myalgias.        Objective:    BP 110/72 (Cuff Size: Normal)   Pulse 79   Temp 98.8 F (37.1 C) (Oral)   Resp 16   Ht 5' 3 (1.6 m)   Wt 137 lb 8 oz (62.4 kg)   SpO2 98%   BMI 24.36 kg/m  BP Readings from Last 3 Encounters:  02/13/24 110/72  11/11/23 110/70  02/14/23 110/70   Wt Readings from Last 3 Encounters:  02/13/24 137 lb 8 oz (62.4 kg)  11/11/23 132 lb 8 oz (60.1 kg)  02/14/23 124 lb 4.8 oz (56.4 kg)      Physical Exam Constitutional:      Appearance: Normal appearance.  HENT:      Head: Normocephalic and atraumatic.  Eyes:     Conjunctiva/sclera: Conjunctivae normal.  Cardiovascular:     Rate and Rhythm: Normal rate and regular rhythm.  Pulmonary:     Effort: Pulmonary effort is normal.     Breath sounds: Normal breath sounds.  Musculoskeletal:     Right shoulder: Tenderness present. No bony tenderness or crepitus. Normal range of motion. Normal strength.     Comments: Reproducible right chest wall pain to palpation   Skin:    General: Skin is warm and dry.  Neurological:     General: No focal deficit present.     Mental Status: She is alert. Mental status is at baseline.  Psychiatric:        Mood and Affect: Mood normal.        Behavior: Behavior normal.     No results found for any visits on 02/13/24.      Assessment & Plan:   Assessment & Plan Right shoulder and chest wall muscle strain Sharp pain in right chest wall, likely muscular origin due to fall. Differential includes costochondritis and intercostal muscle strain. No rotator cuff tear evident based on exam. -  Continue Celecoxib  100 mg twice daily with food for 7-10 days. - Prescribed Tizanidine  at bedtime, starting with half dose to assess tolerance. - Advised heating pad and topical medications like Voltaren or Bengay. - Instructed to avoid other anti-inflammatories such as ibuprofen  or Advil . - Consider referral to orthopedic specialist or further imaging if pain persists after 10 days.   - celecoxib  (CELEBREX ) 100 MG capsule; Take 1 capsule (100 mg total) by mouth 2 (two) times daily.  Dispense: 30 capsule; Refill: 1 - tiZANidine  (ZANAFLEX ) 4 MG tablet; Take 1 tablet (4 mg total) by mouth at bedtime as needed.  Dispense: 30 tablet; Refill: 0  Return for already scheduled.  Sharyle Fischer, DO   "

## 2024-05-11 ENCOUNTER — Ambulatory Visit: Admitting: Internal Medicine

## 2024-11-11 ENCOUNTER — Encounter: Admitting: Internal Medicine
